# Patient Record
Sex: Female | Born: 1990 | Race: Black or African American | Hispanic: Yes | Marital: Married | Smoking: Never smoker
Health system: Southern US, Community
[De-identification: ages and names within clinical notes are randomized; demographics above are authoritative.]

## PROBLEM LIST (undated history)

## (undated) ENCOUNTER — Inpatient Hospital Stay (HOSPITAL_COMMUNITY): Payer: Self-pay

## (undated) DIAGNOSIS — G43909 Migraine, unspecified, not intractable, without status migrainosus: Secondary | ICD-10-CM

## (undated) DIAGNOSIS — R002 Palpitations: Secondary | ICD-10-CM

## (undated) HISTORY — PX: WISDOM TOOTH EXTRACTION: SHX21

## (undated) HISTORY — DX: Palpitations: R00.2

## (undated) HISTORY — DX: Migraine, unspecified, not intractable, without status migrainosus: G43.909

---
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
## 2013-04-12 ENCOUNTER — Ambulatory Visit: Payer: Self-pay | Admitting: Family Medicine

## 2015-05-02 ENCOUNTER — Encounter (HOSPITAL_COMMUNITY): Payer: Self-pay

## 2015-05-02 ENCOUNTER — Emergency Department (HOSPITAL_COMMUNITY)
Admission: EM | Admit: 2015-05-02 | Discharge: 2015-05-02 | Disposition: A | Discharge status: Home / Self Care | Payer: BLUE CROSS/BLUE SHIELD | Attending: Emergency Medicine | Admitting: Emergency Medicine

## 2015-05-02 DIAGNOSIS — S199XXA Unspecified injury of neck, initial encounter: Secondary | ICD-10-CM | POA: Insufficient documentation

## 2015-05-02 DIAGNOSIS — Y9389 Activity, other specified: Secondary | ICD-10-CM | POA: Insufficient documentation

## 2015-05-02 DIAGNOSIS — Y998 Other external cause status: Secondary | ICD-10-CM | POA: Insufficient documentation

## 2015-05-02 DIAGNOSIS — R519 Headache, unspecified: Secondary | ICD-10-CM

## 2015-05-02 DIAGNOSIS — Y92410 Unspecified street and highway as the place of occurrence of the external cause: Secondary | ICD-10-CM | POA: Insufficient documentation

## 2015-05-02 DIAGNOSIS — S0990XA Unspecified injury of head, initial encounter: Secondary | ICD-10-CM | POA: Insufficient documentation

## 2015-05-02 DIAGNOSIS — R51 Headache: Secondary | ICD-10-CM

## 2015-05-02 MED ORDER — HYDROCODONE-ACETAMINOPHEN 5-325 MG PO TABS: ORAL_TABLET | ORAL | Status: DC

## 2015-05-02 MED ORDER — HYDROCODONE-ACETAMINOPHEN 5-325 MG PO TABS
1.0000 | ORAL_TABLET | Freq: Once | ORAL | Status: AC
  Administered 2015-05-02: 1 via ORAL
  Filled 2015-05-02: qty 1
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
## 2015-05-02 NOTE — Discharge Instructions (Signed)
For pain control please take ibuprofen (also known as Motrin or Advil) 800mg (this is normally 4 over the counter pills) 3 times a day  for 5 days. Take with food to minimize stomach irritation. ° °Take vicodin for breakthrough pain, do not drink alcohol, drive, care for children or do other critical tasks while taking vicodin. ° ° °Do not hesitate to return to the emergency room for any new, worsening or concerning symptoms. ° °Please obtain primary care using resource guide below. Let them know that you were seen in the emergency room and that they will need to obtain records for further outpatient management. ° ° °Emergency Department Resource Guide °1) Find a Doctor and Pay Out of Pocket °Although you won't have to find out who is covered by your insurance plan, it is a good idea to ask around and get recommendations. You will then need to call the office and see if the doctor you have chosen will accept you as a new patient and what types of options they offer for patients who are self-pay. Some doctors offer discounts or will set up payment plans for their patients who do not have insurance, but you will need to ask so you aren't surprised when you get to your appointment. ° °2) Contact Your Local Health Department °Not all health departments have doctors that can see patients for sick visits, but many do, so it is worth a call to see if yours does. If you don't know where your local health department is, you can check in your phone book. The CDC also has a tool to help you locate your state's health department, and many state websites also have listings of all of their local health departments. ° °3) Find a Walk-in Clinic °If your illness is not likely to be very severe or complicated, you may want to try a walk in clinic. These are popping up all over the country in pharmacies, drugstores, and shopping centers. They're usually staffed by nurse practitioners or physician assistants that have been trained to  treat common illnesses and complaints. They're usually fairly quick and inexpensive. However, if you have serious medical issues or chronic medical problems, these are probably not your best option. ° °No Primary Care Doctor: °- Call [facility] at  832-8000 - they can help you locate a primary care doctor that  accepts your insurance, provides certain services, etc. °- Physician Referral Service- 1-800-533-3463 ° °Chronic Pain Problems: °Organization         Address  Phone   Notes  °[facility]  (336) 297-2271 Patients need to be referred by their primary care doctor.  ° °Medication Assistance: °Organization         Address  Phone   Notes  °[place] [facility] [address] °[place], [place] [address] (336) 641-8030 --Must be a resident of [place] °-- Must have NO insurance coverage whatsoever (no Medicaid/ Medicare, etc.) °-- The pt. MUST have a primary care doctor that directs their care regularly and follows them in the community °  °[facility]  (866) 331-1348   °[employer]  (888) 892-1162   ° °Agencies that provide inexpensive medical care: °Organization         Address  Phone   Notes  °[facility]  (336) 832-8035   °[facility]    (336) 832-7272   °[facility] [address] °[place], [place] [address] (336) 832-4777   °  [facility] [address], °[place] (336) 271-4999   °[facility]    (336) 373-0678   °[facility]    (336) 272-1050   °[facility] ° [address], [place] Phone:  (336) 832-4444, Fax:  (336) 832-4440 Hours of Operation:  9 am - 6 pm, M-F.  Also accepts Medicaid/Medicare and self-pay.  °[facility] ° [address], [place] Phone: (336) 832-3150, Fax: (336) [phone]. Hours of Operation:  8:30 am - 5:30 pm, M-F.  Also accepts Medicaid and self-pay.  °[facility]  [place] [address], [place] Phone: (336) 878-6027   °[facility] [address], [place], [place] (336)723-1848, Ext. 123 Mondays & Thursdays: 7-9 AM.  First 15 patients are seen on a first come, first serve basis. °  ° °Medicaid-accepting [place] Providers: ° °Organization         Address  Phone   Notes  °[facility] [address], [place] (336) 641-2100 Also accepts self-pay patients.  °[facility] [address], [place] ° (336) 856-9996   °[facility] [address], [place] (336) 288-8857   °[facility] [address] [place] [address], [place] (336) 299-7000   °Veita Bland [address], [place]  ° (336) 373-1557 Only accepts [place] Access Medicaid patients after they have their name applied to their card.  ° °Self-Pay (no insurance) in [place]: ° °Organization         Address  Phone   Notes  °Sickle Cell Patients, [facility] [address], [place] (336) 832-1970   °[facility] [address], [place] (336) 832-4400   °[facility] [place] [facility] ° [address], [place] (336) 992-4800   °[facility]/Dr. Osei-Bonsu ° [address] [place] [address], [place] or [address], [place] (336) 841-8500 Phone number for both [place] and [place] locations is the same.  °[facility] [address], [place] (336) 299-0000   °[facility] [address] [place] [address], [place] or [address] (336) 852-7530 °(336) 878-2260   °[facility] [address], [place] (336) 350-1642, phone; (336) 294-5005, fax Sees patients 1st and 3rd Saturday of every month.  Must not qualify for public or private insurance (i.e. Medicaid, Medicare, [place] Health Choice, Veterans' Benefits) • Household income should be no more than 200%  of the poverty level •The clinic cannot treat you if you are pregnant or think you are pregnant • Sexually transmitted diseases are not treated at the clinic.  ° ° °Dental Care: °Organization         Address  Phone  Notes  °[facility] [address],  (336) 641-6152 Accepts children up to age 21 who are enrolled in Medicaid or [place] Health Choice; pregnant women with a Medicaid card; and children who have applied for Medicaid or [place] Health Choice, but were declined, whose parents can pay a reduced fee at time of service.  °[facility] [place]  [address], [place] (336) 641-7733 Accepts children up to age 21 who are enrolled in Medicaid or  Health Choice; pregnant women with a Medicaid card; and   children who have applied for Medicaid or [place] Health Choice, but were declined, whose parents can pay a reduced fee at time of service.  °[facility] ° [address], [place] (336) 641-4533 Patients are seen by appointment only. Walk-ins are not accepted. [facility] will see patients 18 years of age and older. °Monday - Tuesday (8am-5pm) °Most Wednesdays (8:30-5pm) °$30 per visit, cash only  °[facility] ° [address], [place] (336) 641-4533 Patients are seen by appointment only. Walk-ins are not accepted. [facility] will see patients 18 years of age and older. °One Wednesday Evening (Monthly: Volunteer Based).  $30 per visit, cash only  °[facility]  (919) 537-3737 for adults; Children under age 4, call Graduate Pediatric Dentistry at ([phone]) [phone]. Children aged 4-14, please call (919) [phone] to request a pediatric application. ° Dental services are provided in all areas of dental care including fillings, crowns and bridges, complete and partial dentures, implants, gum treatment, root canals, and  extractions. Preventive care is also provided. Treatment is provided to both adults and children. °Patients are selected via a lottery and there is often a waiting list. °  °[facility] [address], °[place] ° (336) 763-8833 [email or web address] °  °[facility] [address], [place], [place] (336)723-1848, Ext. 123 Second and Fourth Thursday of each month, opens at 6:30 AM; Clinic ends at 9 AM.  Patients are seen on a first-come first-served basis, and a limited number are seen during each clinic.  ° °[facility] ° [address], [place], [place] (336) 723-7904   Eligibility Requirements °You must have lived in [place], [place], or [place] counties for at least the last three months. °  You cannot be eligible for state or federal sponsored healthcare insurance, including Veterans Administration, Medicaid, or Medicare. °  You generally cannot be eligible for healthcare insurance through your employer.  °  How to apply: °Eligibility screenings are held every Tuesday and Wednesday afternoon from 1:00 pm until 4:00 pm. You do not need an appointment for the interview!  °[facility] [address], [place], [place] 336-631-2330   °[facility]  336-342-8273   °[facility]  336-703-3100   °[facility]  336-570-6415   ° °Behavioral Health Resources in the Community: °Intensive Outpatient Programs °Organization         Address  Phone  Notes  °[facility] [address], [place], [place] 336-878-6098   °[facility] [address], [place], [place] 336-832-9800   °[facility] [address], [place], [place] ° 336-882-2125   °[facility] [address],  °[place],  1-800-853-5163 or 336-641-4981   °Substance Abuse Resources °Organization         Address  Phone  Notes  °[facility]  336-882-2125     °[facility]  336-784-9470   °[facility]  336-285-9073   °[facility]  336-845-3988   °Residential & Outpatient Substance Abuse Program  1-800-659-3381   °Psychological Services °Organization         Address  Phone  Notes  °[facility]  336- 832-9600   °[facility]  336- 378-7881   °[facility] [address], [place] 1-800-853-5163 or 336-641-4981   ° °Mobile Crisis Teams °Organization           Address  Phone  Notes  °[facility], Mobile Crisis Care Unit  1-877-626-1772   °Assertive °[facility] ° [address] [place], [place] 336-834-9664   °Sharon DeEsch [address] °[place] 336-554-5454   ° °Self-Help/Support Groups °Organization         Address  Phone             Notes  °[facility] [place] - variety of support groups  336- 373-1402 Call for more information  °Narcotics Anonymous (NA), [facility] [address], °[place]  2 meetings at this location  ° °Residential Treatment Programs °Organization         Address  Phone  Notes  °[facility] [address],    °[place]  1-866-801-8205   °[facility] ° [address], [place], [place] 704-293-8524   °[facility] [address], [place] 336-845-3988 Admissions: 8am-3pm M-F  °[facility] [address]    °[place], [place] 336-841-1104   °[facility] [address], [place], [place] 336-379-7146   °[facility] [address]  °[place], [place] 336-285-9073   °[facility] - Intensive Outpatient [address], [place], [place] 336-852-3033   °[facility] [address]  °[place], [place] 1-877-615-2722 or 336-784-9470   °[facility] [address] [place], [place] 336-227-7417 Accepts Medicaid  °[facility] [address]   °[place] 1-800-659-3381 Substance Abuse/Addiction Treatment  ° °[place] Behavioral Health Resources °Organization         Address  Phone  Notes  °[facility]  (888) 581-9988   °Julie Brannon, PhD [address] [place], [place]   (336) 349-5553 or (336) 951-0000   °[facility]   [address] °[place], [place] (336) 349-4454   °[facility] [address], [place], [place] (336) 342-8316 Insurance/Medicaid/sponsorship through [facility]  °[facility] [address]                                    [place], [place] (336) 342-8316 Therapy/tele-psych/case  °[facility] [address]  ° [place], [place] (336) 349-2233    °Dr. Arfeen  (336) 349-4544   °[facility] [place]  [employer] [place] [facility] 1) [address],  °2) [address], [place] °3)  [address], [place] (336) 349-3220 °(336) 342-7768 ° °(336) 342-8140   °[place] [facility] (336) 342-1394 or (336) 342-3537 (After Hours)    ° ° ° °
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
## 2015-05-02 NOTE — ED Notes (Signed)
Ice pack given to patient for forhead

## 2015-05-09 ENCOUNTER — Emergency Department (HOSPITAL_COMMUNITY)
Admission: EM | Admit: 2015-05-09 | Discharge: 2015-05-10 | Disposition: A | Discharge status: Home / Self Care | Payer: BLUE CROSS/BLUE SHIELD | Attending: Emergency Medicine | Admitting: Emergency Medicine

## 2015-05-09 ENCOUNTER — Encounter (HOSPITAL_COMMUNITY): Payer: Self-pay | Admitting: *Deleted

## 2015-05-09 DIAGNOSIS — N751 Abscess of Bartholin's gland: Secondary | ICD-10-CM | POA: Insufficient documentation

## 2015-05-09 MED ORDER — LIDOCAINE HCL (PF) 1 % IJ SOLN
5.0000 mL | Freq: Once | INTRAMUSCULAR | Status: AC
  Administered 2015-05-10: 5 mL via INTRADERMAL
  Filled 2015-05-09: qty 5

## 2015-05-09 MED ORDER — LIDOCAINE-EPINEPHRINE-TETRACAINE (LET) SOLUTION
3.0000 mL | Freq: Once | NASAL | Status: AC
  Administered 2015-05-10: 3 mL via TOPICAL
  Filled 2015-05-09: qty 3

## 2015-05-09 NOTE — ED Notes (Signed)
The pt has an abscess on her labia that started 3 days ago  lmp June 28th

## 2015-05-10 LAB — URINALYSIS, ROUTINE W REFLEX MICROSCOPIC
BILIRUBIN URINE: NEGATIVE
GLUCOSE, UA: NEGATIVE mg/dL
Hgb urine dipstick: NEGATIVE
KETONES UR: NEGATIVE mg/dL
NITRITE: NEGATIVE
PH: 7.5 (ref 5.0–8.0)
Protein, ur: NEGATIVE mg/dL
Specific Gravity, Urine: 1.007 (ref 1.005–1.030)
UROBILINOGEN UA: 1 mg/dL (ref 0.0–1.0)

## 2015-05-10 LAB — URINE MICROSCOPIC-ADD ON

## 2015-05-10 MED ORDER — MORPHINE SULFATE 4 MG/ML IJ SOLN
4.0000 mg | Freq: Once | INTRAMUSCULAR | Status: AC
  Administered 2015-05-10: 4 mg via INTRAVENOUS
  Filled 2015-05-10: qty 1

## 2015-05-10 NOTE — ED Provider Notes (Signed)
CSN: [id number]     Arrival date & time 05/09/15  2340 History   First MD Initiated Contact with Patient 05/09/15 2353     Chief Complaint  Patient presents with  . Abscess     (Consider location/radiation/quality/duration/timing/severity/associated sxs/prior Treatment) HPI   Blood pressure 134/80, pulse 107, temperature 97.7 F (36.5 C), temperature source Oral, resp. rate 16, weight 127 lb (57.607 kg), last menstrual period 04/28/2015, SpO2 99 %.  Sonya Green is a 24 y.o. female complaining of painful swelling to right labia worsening over the course of 5 days. No similar prior episodes. Ports urinary frequency without any abnormal vaginal discharge, fever, chills, nausea, vomiting. She does not have a local primary care or OB/GYN. Recently moved to the area.    History reviewed. No pertinent past medical history. History reviewed. No pertinent past surgical history. No family history on file. History  Substance Use Topics  . Smoking status: Never Smoker   . Smokeless tobacco: Not on file  . Alcohol Use: No   OB History    No data available     Review of Systems    Allergies  Review of patient's allergies indicates no known allergies.  Home Medications   Prior to Admission medications   Medication Sig Start Date End Date Taking? Authorizing Provider  HYDROcodone-acetaminophen (NORCO/VICODIN) 5-325 MG per tablet Take 1-2 tablets by mouth every 6 hours as needed for pain and/or cough. 05/02/15   Chetan Mehring, PA-C   BP 134/80 mmHg  Pulse 107  Temp(Src) 97.7 F (36.5 C) (Oral)  Resp 16  Wt 127 lb (57.607 kg)  SpO2 99%  LMP 04/28/2015 Physical Exam  Constitutional: She is oriented to person, place, and time. She appears well-developed and well-nourished. No distress.  HENT:  Head: Normocephalic.  Eyes: Conjunctivae and EOM are normal. Pupils are equal, round, and reactive to light.  Neck: Normal range of motion.  Cardiovascular: Normal rate.    Pulmonary/Chest: Effort normal. No stridor.  Genitourinary:  Right sided fluctuant tender Bartholin's gland cyst, no surrounding cellulitis, no drainage  Musculoskeletal: Normal range of motion.  Neurological: She is alert and oriented to person, place, and time.  Psychiatric:  Very nervous  Nursing note and vitals reviewed.   ED Course  INCISION AND DRAINAGE Date/Time: 05/10/2015 1:09 AM Performed by: Wynetta EmeryPISCIOTTA, Aleeah Greeno Authorized by: Wynetta EmeryPISCIOTTA, Massie Cogliano Consent: Verbal consent obtained. Consent given by: patient Required items: required blood products, implants, devices, and special equipment available Patient identity confirmed: verbally with patient Type: abscess (Right bartholin's) Body area: anogenital Location details: Bartholin's gland Anesthesia: local infiltration Local anesthetic: lidocaine 1% without epinephrine and LET (lido,epi,tetracaine) Anesthetic total: 1 ml Patient sedated: no Scalpel size: 11 Incision type: single straight Complexity: simple Drainage: purulent Drainage amount: moderate Wound treatment: wound left open Patient tolerance: Patient tolerated the procedure well with no immediate complications   (including critical care time) Labs Review Labs Reviewed - No data to display  Imaging Review No results found.   EKG Interpretation None      MDM   Final diagnoses:  Bartholin's gland abscess    Filed Vitals:   05/09/15 2349  BP: 134/80  Pulse: 107  Temp: 97.7 F (36.5 C)  TempSrc: Oral  Resp: 16  Weight: 127 lb (57.607 kg)  SpO2: 99%    Medications  lidocaine-EPINEPHrine-tetracaine (LET) solution (3 mLs Topical Given 05/10/15 0019)  lidocaine (PF) (XYLOCAINE) 1 % injection 5 mL (5 mLs Intradermal Given 05/10/15 0019)  morphine 4 MG/ML injection 4 mg (  4 mg Intravenous Given 05/10/15 0019)    Sonya Endo is a pleasant 24 y.o. female presenting with right sided bartholin's gland abscess. I and D performed and moderate amount of  purulent material is expressed. GC and abscess cultures are obtained. Patient is instructed on wound care and return precautions. [facility]  This is a shared visit with the attending physician who personally evaluated the patient and agrees with the care plan.   Evaluation does not show pathology that would require ongoing emergent intervention or inpatient treatment. Pt is hemodynamically stable and mentating appropriately. Discussed findings and plan with patient/guardian, who agrees with care plan. All questions answered. Return precautions discussed and outpatient follow up given.       Wynetta Emery, PA-C 05/10/15 0115  Blake Divine, MD 05/12/15 [phone]

## 2015-05-10 NOTE — Discharge Instructions (Signed)
Please follow with your primary care doctor in the next 2 days for a check-up. They must obtain records for further management.  ° °Do not hesitate to return to the Emergency Department for any new, worsening or concerning symptoms.  ° ° °Abscess °Care After °An abscess (also called a boil or furuncle) is an infected area that contains a collection of pus. Signs and symptoms of an abscess include pain, tenderness, redness, or hardness, or you may feel a moveable soft area under your skin. An abscess can occur anywhere in the body. The infection may spread to surrounding tissues causing cellulitis. A cut (incision) by the surgeon was made over your abscess and the pus was drained out. Gauze may have been packed into the space to provide a drain that will allow the cavity to heal from the inside outwards. The boil may be painful for 5 to 7 days. Most people with a boil do not have high fevers. Your abscess, if seen early, may not have localized, and may not have been lanced. If not, another appointment may be required for this if it does not get better on its own or with medications. °HOME CARE INSTRUCTIONS  °· Only take over-the-counter or prescription medicines for pain, discomfort, or fever as directed by your caregiver. °· When you bathe, soak and then remove gauze or iodoform packs at least daily or as directed by your caregiver. You may then wash the wound gently with mild soapy water. Repack with gauze or do as your caregiver directs. °SEEK IMMEDIATE MEDICAL CARE IF:  °· You develop increased pain, swelling, redness, drainage, or bleeding in the wound site. °· You develop signs of generalized infection including muscle aches, chills, fever, or a general ill feeling. °· An oral temperature above 102° F (38.9° C) develops, not controlled by medication. °See your caregiver for a recheck if you develop any of the symptoms described above. If medications (antibiotics) were prescribed, take them as  directed. °Document Released: 05/13/2005 Document Revised: 01/17/2012 Document Reviewed: 01/08/2008 °ExitCare® Patient Information ©2015 [employer]. This information is not intended to replace advice given to you by your health care provider. Make sure you discuss any questions you have with your health care provider. ° °

## 2015-05-12 LAB — CULTURE, ROUTINE-ABSCESS

## 2015-05-13 ENCOUNTER — Telehealth (HOSPITAL_BASED_OUTPATIENT_CLINIC_OR_DEPARTMENT_OTHER): Payer: Self-pay | Admitting: Emergency Medicine

## 2015-05-13 LAB — GC/CHLAMYDIA PROBE AMP (~~LOC~~) NOT AT ARMC
CHLAMYDIA, DNA PROBE: POSITIVE — AB
NEISSERIA GONORRHEA: NEGATIVE

## 2015-05-13 NOTE — Progress Notes (Signed)
ED Antimicrobial Stewardship Positive Culture Follow Up   Sonya Green is an 24 y.o. female who presented to [facility] 05[facility]2015 with a chief complaint of  Chief Complaint  Patient presents with  . Abscess    Recent Results (from the past 720 hour(s))  Culture, routine-abscess     Status: None   Collection Time: 05/10/15  1:00 AM  Result Value Ref Range Status   Specimen Description ABSCESS  Final   Special Requests RIGHT LABIA  Final   Gram Stain   Final    ABUNDANT WBC PRESENT,BOTH PMN AND MONONUCLEAR NO SQUAMOUS EPITHELIAL CELLS SEEN FEW GRAM POSITIVE COCCI IN CLUSTERS Performed at [facility]    Culture   Final    MODERATE STAPHYLOCOCCUS AUREUS Note: RIFAMPIN AND GENTAMICIN SHOULD NOT BE USED AS SINGLE DRUGS FOR TREATMENT OF STAPH INFECTIONS. Performed at [facility]    Report Status 05/12/2015 FINAL  Final   Organism ID, Bacteria STAPHYLOCOCCUS AUREUS  Final      Susceptibility   Staphylococcus aureus - MIC*    CLINDAMYCIN <=0.25 SENSITIVE Sensitive     ERYTHROMYCIN <=0.25 SENSITIVE Sensitive     GENTAMICIN <=0.5 SENSITIVE Sensitive     LEVOFLOXACIN 4 INTERMEDIATE Intermediate     OXACILLIN <=0.25 SENSITIVE Sensitive     PENICILLIN 0.06 SENSITIVE Sensitive     RIFAMPIN <=0.5 SENSITIVE Sensitive     TRIMETH/SULFA <=10 SENSITIVE Sensitive     VANCOMYCIN 1 SENSITIVE Sensitive     TETRACYCLINE <=1 SENSITIVE Sensitive     MOXIFLOXACIN 2 RESISTANT Resistant     * MODERATE STAPHYLOCOCCUS AUREUS    [x]  Patient discharged originally without antimicrobial agent and treatment is now indicated  New antibiotic prescription: Bactrim DS 1 Tab BID x 1 week  ED Provider: Allen DerryMercedes Camprubi-Soms, PA-C  Sonya Green 05/13/2015, 8:12 AM Infectious Diseases Pharmacist Phone# [phone]

## 2015-05-14 ENCOUNTER — Telehealth (HOSPITAL_BASED_OUTPATIENT_CLINIC_OR_DEPARTMENT_OTHER): Payer: Self-pay | Admitting: Emergency Medicine

## 2015-05-14 NOTE — Telephone Encounter (Signed)
Spoke with patient @ 1100 05/13/15, notified of staph in abcess , requested antibiotic Bactrim DS on tab po bid x one week, ordered by Ebbie Ridgehris Lawyer PA, [facility] to [facility] [address] [address] @ [phone]

## 2015-05-14 NOTE — Telephone Encounter (Signed)
Chart handoff to EDP for treatment plan for + chlamydia 

## 2015-05-15 ENCOUNTER — Telehealth (HOSPITAL_COMMUNITY): Payer: Self-pay

## 2015-05-15 NOTE — ED Notes (Signed)
Chart returned from EDP office. Dr Jeraldine LootsLockwood reviewed chart and ordered Zithromax 1 gram po x 1 with no refills. Pt notified and advised to abstain from sexual activity x 10 days and to notify partner(s) for testing and treatment.  [facility] [address] [phone] and left on MD voicemail.

## 2015-05-17 ENCOUNTER — Telehealth: Payer: Self-pay | Admitting: Emergency Medicine

## 2015-05-17 NOTE — Telephone Encounter (Signed)
Patient has Zithromax allergy, RX Doxycycline 100 mg PO BID x seven days, prescriber, Ivar Drapeob Browning PA,  [facility] [phone], Patient notified of change and that new RX called in .

## 2015-05-22 ENCOUNTER — Ambulatory Visit: Payer: Self-pay | Admitting: Family

## 2015-05-29 ENCOUNTER — Telehealth: Payer: Self-pay | Admitting: *Deleted

## 2015-05-29 NOTE — Telephone Encounter (Signed)
Pt called about Rx called in by Berle Mull, RN.  NCM reviewed notes to find: "Spoke with patient @ 1100 05/13/15, notified of staph in abcess , requested antibiotic Bactrim DS on tab po bid x one week, ordered by Ebbie Ridge PA, called [facility] [address] @ [phone]"  NCM shared with pt that Rx called in [facility].  Pt say she will check with them as she called [facility] [address] instead.

## 2015-05-30 ENCOUNTER — Ambulatory Visit: Payer: Self-pay | Admitting: Family Medicine

## 2015-06-12 ENCOUNTER — Ambulatory Visit: Payer: BLUE CROSS/BLUE SHIELD

## 2015-06-12 ENCOUNTER — Ambulatory Visit (INDEPENDENT_AMBULATORY_CARE_PROVIDER_SITE_OTHER): Payer: BLUE CROSS/BLUE SHIELD | Admitting: Family

## 2015-06-12 ENCOUNTER — Encounter: Payer: Self-pay | Admitting: Family

## 2015-06-12 VITALS — BP 120/84 | HR 91 | Temp 97.6°F | Resp 18 | Ht 67.0 in | Wt 130.8 lb

## 2015-06-12 DIAGNOSIS — Z3009 Encounter for other general counseling and advice on contraception: Secondary | ICD-10-CM

## 2015-06-12 DIAGNOSIS — Z309 Encounter for contraceptive management, unspecified: Secondary | ICD-10-CM | POA: Diagnosis not present

## 2015-06-12 MED ORDER — MEDROXYPROGESTERONE ACETATE 150 MG/ML IM SUSP
150.0000 mg | Freq: Once | INTRAMUSCULAR | Status: AC
  Administered 2015-06-12: 150 mg via INTRAMUSCULAR

## 2015-06-12 MED ORDER — MEDROXYPROGESTERONE ACETATE 150 MG/ML IM SUSP: 150.0000 mg | INTRAMUSCULAR | Status: DC

## 2015-06-12 NOTE — Patient Instructions (Addendum)
Thank you for choosing [facility].  Summary/Instructions:  Referrals have been made during this visit. You should expect to hear back from our schedulers in about 7-10 days in regards to establishing an appointment with the specialists we discussed.   If your symptoms worsen or fail to improve, please contact our office for further instruction, or in case of emergency go directly to the emergency room at the closest medical facility.     

## 2015-06-12 NOTE — Progress Notes (Signed)
Subjective:    Patient ID: Sonya Green, female    DOB: Jul 03, 1991, 24 y.o.   MRN: [id number]  Chief Complaint  Patient presents with  . Establish Care    Referral to GYN, would like to start birth control    HPI:  Sonya Green is a 24 y.o. female with a PMH of migraines and hepatitis/jaundice who presents today for an office visit to establish care.   1.) Birth Control - Previously maintained on [name] and has noted the associated symptom hormonal issues. She has been on the [name] for several years and would like to explore other options of birth control.   Allergies  Allergen Reactions  . Sulfa Antibiotics Rash  . Zithromax [Azithromycin] Rash     Outpatient Prescriptions Prior to Visit  Medication Sig Dispense Refill  . HYDROcodone-acetaminophen (NORCO/VICODIN) 5-325 MG per tablet Take 1-2 tablets by mouth every 6 hours as needed for pain and/or cough. 7 tablet 0   No facility-administered medications prior to visit.     Past Medical History  Diagnosis Date  . Jaundice of newborn   . Migraines      History reviewed. No pertinent past surgical history.   Family History  Problem Relation Age of Onset  . Healthy Mother   . Arthritis Maternal Grandmother   . Stroke Maternal Grandmother   . Diabetes Maternal Grandmother   . Lung cancer Maternal Grandfather   . Diabetes Paternal Grandmother      History   Social History  . Marital Status: Single    Spouse Name: N/A  . Number of Children: N/A  . Years of Education: 12   Occupational History  . Housekeeping    Social History Main Topics  . Smoking status: Former Games developer  . Smokeless tobacco: Never Used  . Alcohol Use: Yes     Comment: Occasionally  . Drug Use: No  . Sexual Activity: Yes    Birth Control/ Protection: None, Injection   Other Topics Concern  . Not on file   Social History Narrative   Fun: Sports   Denies religious beliefs effecting health care.   Denies abuse  and feels safe at home.       Review of Systems  Constitutional: Negative for fever and chills.  Respiratory: Negative for chest tightness and shortness of breath.   Cardiovascular: Negative for chest pain, palpitations and leg swelling.  Gastrointestinal: Negative for nausea.  Genitourinary: Negative for menstrual problem.      Objective:    BP 120/84 mmHg  Pulse 91  Temp(Src) 97.6 F (36.4 C) (Oral)  Resp 18  Ht 5\' 7"  (1.702 m)  Wt 130 lb 12.8 oz (59.33 kg)  BMI 20.48 kg/m2  SpO2 99% Nursing note and vital signs reviewed.  Physical Exam  Constitutional: She is oriented to person, place, and time. She appears well-developed and well-nourished. No distress.  Cardiovascular: Normal rate, regular rhythm, normal heart sounds and intact distal pulses.   Pulmonary/Chest: Effort normal and breath sounds normal.  Neurological: She is alert and oriented to person, place, and time.  Skin: Skin is warm and dry.  Psychiatric: She has a normal mood and affect. Her behavior is normal. Judgment and thought content normal.       Assessment & Plan:   Problem List Items Addressed This Visit      Other   Birth control counseling - Primary    Currently maintained on [name]. Discussed options for alternative methods of birth control including intrauterine  devices, oral contraceptives, and Nexplanon. Following discussion she would like to stay with her current birth control at this time and would like to be referred to GYN for further management of her birth control. Referral to GYN placed. Continue current dosage [name]. Follow up as needed.       Relevant Medications   medroxyPROGESTERone ([name]) 150 MG/ML injection   Other Relevant Orders   Ambulatory referral to Gynecology

## 2015-06-12 NOTE — Assessment & Plan Note (Signed)
Currently maintained on [name]. Discussed options for alternative methods of birth control including intrauterine devices, oral contraceptives, and Nexplanon. Following discussion she would like to stay with her current birth control at this time and would like to be referred to GYN for further management of her birth control. Referral to GYN placed. Continue current dosage [name]. Follow up as needed.

## 2015-06-12 NOTE — Progress Notes (Signed)
Pre visit review using our clinic review tool, if applicable. No additional management support is needed unless otherwise documented below in the visit note. 

## 2015-07-02 ENCOUNTER — Encounter: Payer: Self-pay | Admitting: Family

## 2015-09-04 ENCOUNTER — Emergency Department (HOSPITAL_COMMUNITY)
Admission: EM | Admit: 2015-09-04 | Discharge: 2015-09-04 | Disposition: A | Discharge status: Home / Self Care | Payer: Self-pay

## 2015-10-22 ENCOUNTER — Other Ambulatory Visit: Payer: BLUE CROSS/BLUE SHIELD

## 2015-10-22 ENCOUNTER — Encounter: Payer: Self-pay | Admitting: Family

## 2015-10-22 ENCOUNTER — Ambulatory Visit (INDEPENDENT_AMBULATORY_CARE_PROVIDER_SITE_OTHER): Payer: BLUE CROSS/BLUE SHIELD | Admitting: Family

## 2015-10-22 VITALS — BP 120/74 | HR 104 | Temp 97.8°F | Resp 16 | Ht 67.0 in | Wt 131.4 lb

## 2015-10-22 DIAGNOSIS — Z3009 Encounter for other general counseling and advice on contraception: Secondary | ICD-10-CM

## 2015-10-22 DIAGNOSIS — Z30011 Encounter for initial prescription of contraceptive pills: Secondary | ICD-10-CM | POA: Diagnosis not present

## 2015-10-22 DIAGNOSIS — Z309 Encounter for contraceptive management, unspecified: Secondary | ICD-10-CM

## 2015-10-22 DIAGNOSIS — R21 Rash and other nonspecific skin eruption: Secondary | ICD-10-CM

## 2015-10-22 DIAGNOSIS — Z23 Encounter for immunization: Secondary | ICD-10-CM | POA: Diagnosis not present

## 2015-10-22 MED ORDER — MEDROXYPROGESTERONE ACETATE 150 MG/ML IM SUSP
150.0000 mg | Freq: Once | INTRAMUSCULAR | Status: AC
  Administered 2015-10-22: 150 mg via INTRAMUSCULAR

## 2015-10-22 NOTE — Addendum Note (Signed)
Addended by: Mercer PodWRENN, Bronsyn Shappell E on: 10/22/2015 03:50 PM   Modules accepted: Orders

## 2015-10-23 ENCOUNTER — Telehealth: Payer: Self-pay | Admitting: Family

## 2015-10-23 LAB — HSV 2 ANTIBODY, IGG

## 2015-10-23 NOTE — Telephone Encounter (Signed)
Please inform patient that her blood work was negative indicating that it is likely related to shaving

## 2015-10-27 NOTE — Telephone Encounter (Signed)
Left detailed message letting pt know. 

## 2016-04-02 ENCOUNTER — Ambulatory Visit (HOSPITAL_COMMUNITY)
Admission: EM | Admit: 2016-04-02 | Discharge: 2016-04-02 | Disposition: A | Discharge status: Home / Self Care | Payer: Managed Care, Other (non HMO) | Attending: Emergency Medicine | Admitting: Emergency Medicine

## 2016-04-02 ENCOUNTER — Encounter (HOSPITAL_COMMUNITY): Payer: Self-pay | Admitting: *Deleted

## 2016-04-02 DIAGNOSIS — N390 Urinary tract infection, site not specified: Secondary | ICD-10-CM

## 2016-04-02 LAB — POCT URINALYSIS DIP (DEVICE)
Bilirubin Urine: NEGATIVE
GLUCOSE, UA: NEGATIVE mg/dL
Hgb urine dipstick: NEGATIVE
Ketones, ur: NEGATIVE mg/dL
NITRITE: POSITIVE — AB
PH: 7 (ref 5.0–8.0)
PROTEIN: NEGATIVE mg/dL
Specific Gravity, Urine: 1.025 (ref 1.005–1.030)
Urobilinogen, UA: 0.2 mg/dL (ref 0.0–1.0)

## 2016-04-02 LAB — POCT PREGNANCY, URINE: PREG TEST UR: NEGATIVE

## 2016-04-02 MED ORDER — NITROFURANTOIN MONOHYD MACRO 100 MG PO CAPS: 100.0000 mg | ORAL_CAPSULE | Freq: Two times a day (BID) | ORAL | Status: DC

## 2016-04-02 NOTE — Discharge Instructions (Signed)
It appears you have a urinary tract infection. Will treat with an antibiotic. Drink plenty of water as well. If you worsen such as high fevers, severe nausea or vomitng, or worseing back pain then please f/u. Feel better.   Urinary Tract Infection A urinary tract infection (UTI) can occur any place along the urinary tract. The tract includes the kidneys, ureters, bladder, and urethra. A type of germ called bacteria often causes a UTI. UTIs are often helped with antibiotic medicine.  HOME CARE   If given, take antibiotics as told by your doctor. Finish them even if you start to feel better.  Drink enough fluids to keep your pee (urine) clear or pale yellow.  Avoid tea, drinks with caffeine, and bubbly (carbonated) drinks.  Pee often. Avoid holding your pee in for a long time.  Pee before and after having sex (intercourse).  Wipe from front to back after you poop (bowel movement) if you are a woman. Use each tissue only once. GET HELP RIGHT AWAY IF:   You have back pain.  You have lower belly (abdominal) pain.  You have chills.  You feel sick to your stomach (nauseous).  You throw up (vomit).  Your burning or discomfort with peeing does not go away.  You have a fever.  Your symptoms are not better in 3 days. MAKE SURE YOU:   Understand these instructions.  Will watch your condition.  Will get help right away if you are not doing well or get worse.   This information is not intended to replace advice given to you by your health care provider. Make sure you discuss any questions you have with your health care provider.   Document Released: 04/12/2008 Document Revised: 11/15/2014 Document Reviewed: 05/25/2012 Elsevier Interactive Patient Education [employer]2016 Elsevier Inc.

## 2016-04-02 NOTE — ED Notes (Signed)
Pt  Reports  Pain  l  Side of back  Radiating  To  llq    For sev  Days  -  She  Reports  Some  Low  abd / bladder  Pressure  As   Well      pt  Reports  Has     Been  Drinking  Extra  Water     And  Reports the symptoms  Are  Similar  As to  When  She  Had  A  Bladder  Infection in past

## 2016-04-02 NOTE — ED Provider Notes (Signed)
CSN: [id number]     Arrival date & time 04/02/16  1511 History   First MD Initiated Contact with Patient 04/02/16 1535     Chief Complaint  Patient presents with  . Flank Pain   (Consider location/radiation/quality/duration/timing/severity/associated sxs/prior Treatment) HPI Comments: Patient is a 25 yo female who presents with a 4 days history of right sided flank pain and "pressure" in her bladder. She has felt this way before when she had a UTI. She notes no dysuria, but frequency. No N, V or fevers. No vaginal pain or discharge.   The history is provided by the patient.    Past Medical History  Diagnosis Date  . Jaundice of newborn   . Migraines    History reviewed. No pertinent past surgical history. Family History  Problem Relation Age of Onset  . Healthy Mother   . Arthritis Maternal Grandmother   . Stroke Maternal Grandmother   . Diabetes Maternal Grandmother   . Lung cancer Maternal Grandfather   . Diabetes Paternal Grandmother    Social History  Substance Use Topics  . Smoking status: Former Games developermoker  . Smokeless tobacco: Never Used  . Alcohol Use: Yes     Comment: Occasionally   OB History    No data available     Review of Systems  Constitutional: Negative for fever and chills.  Gastrointestinal: Negative for nausea and abdominal pain.  Genitourinary: Positive for frequency and flank pain. Negative for dysuria, urgency, vaginal bleeding and pelvic pain.  Musculoskeletal: Positive for back pain.    Allergies  Sulfa antibiotics and Zithromax  Home Medications   Prior to Admission medications   Medication Sig Start Date End Date Taking? Authorizing Provider  medroxyPROGESTERone ([name]) 150 MG/ML injection Inject 1 mL (150 mg total) into the muscle every 3 (three) months. 06/12/15   Veryl SpeakGregory D Calone, FNP  nitrofurantoin, macrocrystal-monohydrate, (MACROBID) 100 MG capsule Take 1 capsule (100 mg total) by mouth 2 (two) times daily. 04/02/16   Riki SheerMichelle G  Young, PA-C   Meds Ordered and Administered this Visit  Medications - No data to display  BP 119/79 mmHg  Pulse 77  Temp(Src) 98.4 F (36.9 C) (Oral)  Resp 12  SpO2 100%  LMP  (Within Months) No data found.   Physical Exam  Constitutional: She is oriented to person, place, and time. She appears well-developed and well-nourished. No distress.  Abdominal: Soft. Bowel sounds are normal. She exhibits no distension. There is no tenderness. There is no rebound and no guarding.  Mild tenderness with percussion of right CVA region  Neurological: She is alert and oriented to person, place, and time.  Skin: Skin is warm and dry. She is not diaphoretic.  Psychiatric: Her behavior is normal.  Nursing note and vitals reviewed.   ED Course  Procedures (including critical care time)  Labs Review Labs Reviewed  POCT URINALYSIS DIP (DEVICE) - Abnormal; Notable for the following:    Nitrite POSITIVE (*)    Leukocytes, UA SMALL (*)    All other components within normal limits  POCT PREGNANCY, URINE    Imaging Review No results found.   Visual Acuity Review  Right Eye Distance:   Left Eye Distance:   Bilateral Distance:    Right Eye Near:   Left Eye Near:    Bilateral Near:         MDM   1. UTI (lower urinary tract infection)    Treat with Macrobid, and fluids. If worsens then to FU.  Riki Sheer, PA-C 04/02/16 1607

## 2017-01-24 ENCOUNTER — Telehealth: Payer: Self-pay

## 2017-01-24 NOTE — Telephone Encounter (Signed)
Called [phone] to schedule new ob appt. Left v/m  For pt to call our office to schedule appt.

## 2017-03-09 DIAGNOSIS — O0993 Supervision of high risk pregnancy, unspecified, third trimester: Secondary | ICD-10-CM | POA: Insufficient documentation

## 2017-03-10 ENCOUNTER — Encounter: Payer: Managed Care, Other (non HMO) | Admitting: Obstetrics and Gynecology

## 2017-03-17 LAB — OB RESULTS CONSOLE HGB/HCT, BLOOD
HCT: 38
Hemoglobin: 12.6

## 2017-03-17 LAB — OB RESULTS CONSOLE ABO/RH
"RH Type ": NEGATIVE
"RH Type ": POSITIVE

## 2017-03-17 LAB — SICKLE CELL SCREEN: Sickle Cell Screen: NORMAL

## 2017-03-17 LAB — OB RESULTS CONSOLE HIV ANTIBODY (ROUTINE TESTING): HIV: NONREACTIVE

## 2017-03-17 LAB — CYSTIC FIBROSIS DIAGNOSTIC STUDY: Interpretation-CFDNA:: NEGATIVE

## 2017-03-17 LAB — OB RESULTS CONSOLE HEPATITIS B SURFACE ANTIGEN: Hepatitis B Surface Ag: NEGATIVE

## 2017-03-17 LAB — OB RESULTS CONSOLE ANTIBODY SCREEN: Antibody Screen: NEGATIVE

## 2017-03-17 LAB — OB RESULTS CONSOLE RUBELLA ANTIBODY, IGM: Rubella: IMMUNE

## 2017-03-17 LAB — OB RESULTS CONSOLE GC/CHLAMYDIA
CHLAMYDIA, DNA PROBE: NEGATIVE
GC PROBE AMP, GENITAL: NEGATIVE

## 2017-03-17 LAB — OB RESULTS CONSOLE PLATELET COUNT: Platelets: 236

## 2017-03-17 LAB — OB RESULTS CONSOLE VARICELLA ZOSTER ANTIBODY, IGG: Varicella: IMMUNE

## 2017-03-24 ENCOUNTER — Encounter: Payer: Managed Care, Other (non HMO) | Admitting: Certified Nurse Midwife

## 2017-05-09 LAB — OB RESULTS CONSOLE HGB/HCT, BLOOD
HCT: 40
Hemoglobin: 13.2

## 2017-05-09 LAB — OB RESULTS CONSOLE RPR: RPR: NONREACTIVE

## 2017-05-09 LAB — OB RESULTS CONSOLE HIV ANTIBODY (ROUTINE TESTING): HIV: NONREACTIVE

## 2017-06-18 ENCOUNTER — Inpatient Hospital Stay (HOSPITAL_COMMUNITY)
Admission: EM | Admit: 2017-06-18 | Discharge: 2017-06-18 | Disposition: A | Discharge status: Home / Self Care | Payer: Managed Care, Other (non HMO) | Attending: Family Medicine | Admitting: Family Medicine

## 2017-06-18 ENCOUNTER — Encounter (HOSPITAL_COMMUNITY): Payer: Self-pay | Admitting: Emergency Medicine

## 2017-06-18 ENCOUNTER — Emergency Department (HOSPITAL_COMMUNITY): Payer: Managed Care, Other (non HMO)

## 2017-06-18 DIAGNOSIS — Z87891 Personal history of nicotine dependence: Secondary | ICD-10-CM | POA: Diagnosis not present

## 2017-06-18 DIAGNOSIS — R0602 Shortness of breath: Secondary | ICD-10-CM | POA: Insufficient documentation

## 2017-06-18 DIAGNOSIS — Z3A33 33 weeks gestation of pregnancy: Secondary | ICD-10-CM | POA: Diagnosis not present

## 2017-06-18 DIAGNOSIS — R0789 Other chest pain: Secondary | ICD-10-CM | POA: Diagnosis present

## 2017-06-18 DIAGNOSIS — O1410 Severe pre-eclampsia, unspecified trimester: Secondary | ICD-10-CM | POA: Diagnosis present

## 2017-06-18 DIAGNOSIS — O133 Gestational [pregnancy-induced] hypertension without significant proteinuria, third trimester: Secondary | ICD-10-CM | POA: Insufficient documentation

## 2017-06-18 DIAGNOSIS — Z7982 Long term (current) use of aspirin: Secondary | ICD-10-CM | POA: Insufficient documentation

## 2017-06-18 DIAGNOSIS — O26893 Other specified pregnancy related conditions, third trimester: Secondary | ICD-10-CM | POA: Insufficient documentation

## 2017-06-18 DIAGNOSIS — Z3689 Encounter for other specified antenatal screening: Secondary | ICD-10-CM

## 2017-06-18 DIAGNOSIS — I159 Secondary hypertension, unspecified: Secondary | ICD-10-CM

## 2017-06-18 LAB — CBC
HEMATOCRIT: 38.4 % (ref 36.0–46.0)
HEMOGLOBIN: 13.6 g/dL (ref 12.0–15.0)
MCH: 32.2 pg (ref 26.0–34.0)
MCHC: 35.4 g/dL (ref 30.0–36.0)
MCV: 90.8 fL (ref 78.0–100.0)
Platelets: 254 10*3/uL (ref 150–400)
RBC: 4.23 MIL/uL (ref 3.87–5.11)
RDW: 12.3 % (ref 11.5–15.5)
WBC: 11.1 10*3/uL — AB (ref 4.0–10.5)

## 2017-06-18 LAB — BASIC METABOLIC PANEL
ANION GAP: 11 (ref 5–15)
BUN: 10 mg/dL (ref 6–20)
CALCIUM: 9.7 mg/dL (ref 8.9–10.3)
CO2: 19 mmol/L — ABNORMAL LOW (ref 22–32)
Chloride: 104 mmol/L (ref 101–111)
Creatinine, Ser: 0.68 mg/dL (ref 0.44–1.00)
Glucose, Bld: 81 mg/dL (ref 65–99)
POTASSIUM: 4.1 mmol/L (ref 3.5–5.1)
SODIUM: 134 mmol/L — AB (ref 135–145)

## 2017-06-18 LAB — URINALYSIS, ROUTINE W REFLEX MICROSCOPIC
BILIRUBIN URINE: NEGATIVE
Glucose, UA: NEGATIVE mg/dL
HGB URINE DIPSTICK: NEGATIVE
KETONES UR: NEGATIVE mg/dL
Nitrite: POSITIVE — AB
PH: 7 (ref 5.0–8.0)
Protein, ur: NEGATIVE mg/dL
Specific Gravity, Urine: 1.009 (ref 1.005–1.030)

## 2017-06-18 LAB — LACTATE DEHYDROGENASE: LDH: 181 U/L (ref 98–192)

## 2017-06-18 LAB — HEPATIC FUNCTION PANEL
ALK PHOS: 281 U/L — AB (ref 38–126)
ALT: 33 U/L (ref 14–54)
AST: 39 U/L (ref 15–41)
Albumin: 3.4 g/dL — ABNORMAL LOW (ref 3.5–5.0)
BILIRUBIN DIRECT: 0.1 mg/dL (ref 0.1–0.5)
BILIRUBIN TOTAL: 0.6 mg/dL (ref 0.3–1.2)
Indirect Bilirubin: 0.5 mg/dL (ref 0.3–0.9)
TOTAL PROTEIN: 6.8 g/dL (ref 6.5–8.1)

## 2017-06-18 LAB — PROTEIN / CREATININE RATIO, URINE
Creatinine, Urine: 44 mg/dL
Protein Creatinine Ratio: 0.14 mg/mg{Cre} (ref 0.00–0.15)
Total Protein, Urine: 6 mg/dL

## 2017-06-18 MED ORDER — IOPAMIDOL (ISOVUE-370) INJECTION 76%
INTRAVENOUS | Status: AC
  Administered 2017-06-18: 80 mL
  Filled 2017-06-18: qty 100

## 2017-06-18 MED ORDER — HYDRALAZINE HCL 20 MG/ML IJ SOLN
5.0000 mg | Freq: Once | INTRAMUSCULAR | Status: AC
  Administered 2017-06-18: 5 mg via INTRAVENOUS

## 2017-06-18 MED ORDER — SODIUM CHLORIDE 0.9 % IV BOLUS (SEPSIS)
500.0000 mL | Freq: Once | INTRAVENOUS | Status: AC
  Administered 2017-06-18: 500 mL via INTRAVENOUS

## 2017-06-18 MED ORDER — ASPIRIN EC 81 MG PO TBEC: 81.0000 mg | DELAYED_RELEASE_TABLET | Freq: Every day | ORAL | 0 refills | Status: DC

## 2017-06-18 MED ORDER — HYDRALAZINE HCL 20 MG/ML IJ SOLN
INTRAMUSCULAR | Status: DC
Start: 2017-06-18 — End: 2017-06-18
  Filled 2017-06-18: qty 1

## 2017-06-18 NOTE — MAU Provider Note (Signed)
History     CSN: [id number]  Arrival date and time: 06/18/17 0126   First Provider Initiated Contact with Patient 06/18/17 [phone]      Chief Complaint  Patient presents with  . Shortness of Breath  . Pregnant   G1 @33 .5 wks here with CP and SOB. Sx started about 1 day ago. Sx are intermittent. CP is between breasts just above abdomen in the epigastric region. Sx are not occurring currently. She reports intermittent heartburn. She denies HA and visual disturbances. Reports good FM. No VB, LOF, or ctx. [facility] and E[facility] and chest CT was negative. Her BP was found to elevated and she was transferred here for Preeclampsia workup. Her pregnancy has been uncomplicated and [facility]'s [facility].    OB History    Gravida Para Term Preterm AB Living   1             SAB TAB Ectopic Multiple Live Births                  Past Medical History:  Diagnosis Date  . Jaundice of newborn   . Migraines     Past Surgical History:  Procedure Laterality Date  . WISDOM TOOTH EXTRACTION      Family History  Problem Relation Age of Onset  . Healthy Mother   . Arthritis Maternal Grandmother   . Stroke Maternal Grandmother   . Diabetes Maternal Grandmother   . Lung cancer Maternal Grandfather   . Diabetes Paternal Grandmother     Social History  Substance Use Topics  . Smoking status: Former Games developermoker  . Smokeless tobacco: Never Used  . Alcohol use Yes     Comment: Occasionally    Allergies:  Allergies  Allergen Reactions  . Sulfa Antibiotics Rash  . Zithromax [Azithromycin] Rash    Prescriptions Prior to Admission  Medication Sig Dispense Refill Last Dose  . Prenatal Vit-Fe Fumarate-FA (PRENATAL MULTIVITAMIN) TABS tablet Take 1 tablet by mouth daily at 12 noon.   06/17/2017 at Unknown time  . medroxyPROGESTERone ([name]) 150 MG/ML injection Inject 1 mL (150 mg total) into the muscle every 3 (three) months. (Patient not taking: Reported on 06/18/2017) 1  mL  Not Taking at Unknown time  . nitrofurantoin, macrocrystal-monohydrate, (MACROBID) 100 MG capsule Take 1 capsule (100 mg total) by mouth 2 (two) times daily. (Patient not taking: Reported on 06/18/2017) 14 capsule 0 Completed Course at Unknown time    Review of Systems  Eyes: Negative for visual disturbance.  Respiratory: Positive for shortness of breath.   Cardiovascular: Positive for chest pain.  Gastrointestinal: Negative for abdominal pain.  Genitourinary: Negative for vaginal bleeding.  Neurological: Negative for headaches.   Physical Exam   Blood pressure 129/82, pulse 69, temperature 98.1 F (36.7 C), resp. rate 14, height 5\' 7"  (1.702 m), weight 145 lb (65.8 kg), SpO2 96 %.  Patient Vitals for the past 24 hrs:  BP Temp Temp src Pulse Resp SpO2 Height Weight  06/18/17 1108 132/88 - - 63 18 - - -  06/18/17 1050 - - - - 16 - - -  06/18/17 1030 131/83 - - 74 - - - -  06/18/17 1015 127/76 - - 72 - - - -  06/18/17 1000 134/83 - - 69 - - - -  06/18/17 0944 129/82 - - 69 - - - -  06/18/17 0930 (!) 135/95 - - 68 - - - -  06/18/17 0915 127/80 - -  75 - - - -  06/18/17 0910 - - - - - 96 % - -  06/18/17 0909 - - - - - 96 % - -  06/18/17 0905 131/72 - - 69 - 100 % - -  06/18/17 0904 - - - - - 99 % - -  06/18/17 0842 131/87 98.1 F (36.7 C) - 76 14 100 % - -  06/18/17 0730 131/87 - - - - - - -  06/18/17 0715 126/88 - - - - - - -  06/18/17 0700 120/67 - - - - - - -  06/18/17 0645 (!) 140/91 - - - - - - -  06/18/17 0630 (!) 148/93 - - - - - - -  06/18/17 0615 (!) 158/86 - - - - - - -  06/18/17 0600 (!) 130/91 - - - - - - -  06/18/17 0545 135/90 - - - - - - -  06/18/17 0530 129/81 - - 76 - 100 % - -  06/18/17 0512 (!) 143/77 98.1 F (36.7 C) Oral 82 14 99 % - -  06/18/17 0152 (!) 145/88 98.1 F (36.7 C) Oral 67 20 100 % - -  06/18/17 0134 - - - - - - 5\' 7"  (1.702 m) 145 lb (65.8 kg)    Physical Exam  Constitutional: She is oriented to person, place, and time. She appears  well-developed and well-nourished. No distress.  HENT:  Head: Normocephalic and atraumatic.  Neck: Normal range of motion.  Cardiovascular: Normal rate, regular rhythm and normal heart sounds.   Respiratory: Effort normal and breath sounds normal. No respiratory distress. She has no wheezes. She has no rales.  GI: Soft. She exhibits no distension. There is no tenderness.  gravid  Musculoskeletal: Normal range of motion. She exhibits no edema.  Neurological: She is alert and oriented to person, place, and time.  Skin: Skin is warm and dry.  Psychiatric: She has a normal mood and affect.  EFM: 125 bpm, mod variability, + accels, no decels Toco: none  Results for orders placed or performed during the hospital encounter of 06/18/17 (from the past 24 hour(s))  Urinalysis, Routine w reflex microscopic     Status: Abnormal   Collection Time: 06/18/17  1:45 AM  Result Value Ref Range   Color, Urine YELLOW YELLOW   APPearance HAZY (A) CLEAR   Specific Gravity, Urine 1.009 1.005 - 1.030   pH 7.0 5.0 - 8.0   Glucose, UA NEGATIVE NEGATIVE mg/dL   Hgb urine dipstick NEGATIVE NEGATIVE   Bilirubin Urine NEGATIVE NEGATIVE   Ketones, ur NEGATIVE NEGATIVE mg/dL   Protein, ur NEGATIVE NEGATIVE mg/dL   Nitrite POSITIVE (A) NEGATIVE   Leukocytes, UA LARGE (A) NEGATIVE   RBC / HPF 0-5 0 - 5 RBC/hpf   WBC, UA 6-30 0 - 5 WBC/hpf   Bacteria, UA RARE (A) NONE SEEN   Squamous Epithelial / LPF 0-5 (A) NONE SEEN  Basic metabolic panel     Status: Abnormal   Collection Time: 06/18/17  1:59 AM  Result Value Ref Range   Sodium 134 (L) 135 - 145 mmol/L   Potassium 4.1 3.5 - 5.1 mmol/L   Chloride 104 101 - 111 mmol/L   CO2 19 (L) 22 - 32 mmol/L   Glucose, Bld 81 65 - 99 mg/dL   BUN 10 6 - 20 mg/dL   Creatinine, Ser 1.61 0.44 - 1.00 mg/dL   Calcium 9.7 8.9 - 09.6 mg/dL   GFR calc  non Af Amer >60 >60 mL/min   GFR calc Af Amer >60 >60 mL/min   Anion gap 11 5 - 15  CBC     Status: Abnormal   Collection  Time: 06/18/17  1:59 AM  Result Value Ref Range   WBC 11.1 (H) 4.0 - 10.5 K/uL   RBC 4.23 3.87 - 5.11 MIL/uL   Hemoglobin 13.6 12.0 - 15.0 g/dL   HCT 56.2 13.0 - 86.5 %   MCV 90.8 78.0 - 100.0 fL   MCH 32.2 26.0 - 34.0 pg   MCHC 35.4 30.0 - 36.0 g/dL   RDW 78.4 69.6 - 29.5 %   Platelets 254 150 - 400 K/uL  Lactate dehydrogenase     Status: None   Collection Time: 06/18/17  7:21 AM  Result Value Ref Range   LDH 181 98 - 192 U/L  Hepatic function panel     Status: Abnormal   Collection Time: 06/18/17  7:21 AM  Result Value Ref Range   Total Protein 6.8 6.5 - 8.1 g/dL   Albumin 3.4 (L) 3.5 - 5.0 g/dL   AST 39 15 - 41 U/L   ALT 33 14 - 54 U/L   Alkaline Phosphatase 281 (H) 38 - 126 U/L   Total Bilirubin 0.6 0.3 - 1.2 mg/dL   Bilirubin, Direct 0.1 0.1 - 0.5 mg/dL   Indirect Bilirubin 0.5 0.3 - 0.9 mg/dL    MAU Course  Procedures  MDM Labs ordered and reviewed. No severe range BPs. No evidence of preeclampsia. Will arrange transfer of care to [facility] for remainder of pregnancy and schedule [place] with antenatal testing. Discussed need for delivery at 37 weeks. Start baby ASA now. Stable for discharge home.   Assessment and Plan   1. [redacted] weeks gestation of pregnancy   2. Secondary hypertension   3. Shortness of breath   4. Other chest pain   5. Pregnancy-induced hypertension in third trimester   6. NST (non-stress test) reactive    Discharge home Follow up with [facility] on 06/20/17-clinic to call- message sent to admin pool Preeclampsia/return precautions Rx baby ASA  Allergies as of 06/18/2017      Reactions   Sulfa Antibiotics Rash   Zithromax [azithromycin] Rash      Medication List    STOP taking these medications   medroxyPROGESTERone 150 MG/ML injection Commonly known as:  [name]   nitrofurantoin (macrocrystal-monohydrate) 100 MG capsule Commonly known as:  MACROBID     TAKE these medications   aspirin EC 81 MG tablet Take 1 tablet (81 mg total) by mouth  daily.   prenatal multivitamin Tabs tablet Take 1 tablet by mouth daily at 12 noon.      Donette Larry, CNM 06/18/2017, 9:58 AM

## 2017-06-18 NOTE — Discharge Instructions (Signed)
Hypertension During Pregnancy °Hypertension, commonly called high blood pressure, is when the force of blood pumping through your arteries is too strong. Arteries are blood vessels that carry blood from the heart throughout the body. Hypertension during pregnancy can cause problems for you and your baby. Your baby may be born early (prematurely) or may not weigh as much as he or she should at birth. Very bad cases of hypertension during pregnancy can be life-threatening. °Different types of hypertension can occur during pregnancy. These include: °· Chronic hypertension. This happens when: °? You have hypertension before pregnancy and it continues during pregnancy. °? You develop hypertension before you are [redacted] weeks pregnant, and it continues during pregnancy. °· Gestational hypertension. This is hypertension that develops after the 20th week of pregnancy. °· Preeclampsia, also called toxemia of pregnancy. This is a very serious type of hypertension that develops only during pregnancy. It affects the whole body, and it can be very dangerous for you and your baby. ° °Gestational hypertension and preeclampsia usually go away within 6 weeks after your baby is born. Women who have hypertension during pregnancy have a greater chance of developing hypertension later in life or during future pregnancies. °What are the causes? °The exact cause of hypertension is not known. °What increases the risk? °There are certain factors that make it more likely for you to develop hypertension during pregnancy. These include: °· Having hypertension during a previous pregnancy or prior to pregnancy. °· Being overweight. °· Being older than age 40. °· Being pregnant for the first time or being pregnant with more than one baby. °· Becoming pregnant using fertilization methods such as IVF (in vitro fertilization). °· Having diabetes, kidney problems, or systemic lupus erythematosus. °· Having a family history of hypertension. ° °What are the  signs or symptoms? °Chronic hypertension and gestational hypertension rarely cause symptoms. Preeclampsia causes symptoms, which may include: °· Increased protein in your urine. Your health care provider will check for this at every visit before you give birth (prenatal visit). °· Severe headaches. °· Sudden weight gain. °· Swelling of the hands, face, legs, and feet. °· Nausea and vomiting. °· Vision problems, such as blurred or double vision. °· Numbness in the face, arms, legs, and feet. °· Dizziness. °· Slurred speech. °· Sensitivity to bright lights. °· Abdominal pain. °· Convulsions. ° °How is this diagnosed? °You may be diagnosed with hypertension during a routine prenatal exam. At each prenatal visit, you may: °· Have a urine test to check for high amounts of protein in your urine. °· Have your blood pressure checked. A blood pressure reading is recorded as two numbers, such as "120 over 80" (or 120/80). The first ("top") number is called the systolic pressure. It is a measure of the pressure in your arteries when your heart beats. The second ("bottom") number is called the diastolic pressure. It is a measure of the pressure in your arteries as your heart relaxes between beats. Blood pressure is measured in a unit called mm Hg. A normal blood pressure reading is: °? Systolic: below 120. °? Diastolic: below 80. ° °The type of hypertension that you are diagnosed with depends on your test results and when your symptoms developed. °· Chronic hypertension is usually diagnosed before 20 weeks of pregnancy. °· Gestational hypertension is usually diagnosed after 20 weeks of pregnancy. °· Hypertension with high amounts of protein in the urine is diagnosed as preeclampsia. °· Blood pressure measurements that stay above 160 systolic, or above 110 diastolic, are   signs of severe preeclampsia. ° °How is this treated? °Treatment for hypertension during pregnancy varies depending on the type of hypertension you have and how  serious it is. °· If you take medicines called ACE inhibitors to treat chronic hypertension, you may need to switch medicines. ACE inhibitors should not be taken during pregnancy. °· If you have gestational hypertension, you may need to take blood pressure medicine. °· If you are at risk for preeclampsia, your health care provider may recommend that you take a low-dose aspirin every day to prevent high blood pressure during your pregnancy. °· If you have severe preeclampsia, you may need to be hospitalized so you and your baby can be monitored closely. You may also need to take medicine (magnesium sulfate) to prevent seizures and to lower blood pressure. This medicine may be given as an injection or through an IV tube. °· In some cases, if your condition gets worse, you may need to deliver your baby early. ° °Follow these instructions at home: °Eating and drinking °· Drink enough fluid to keep your urine clear or pale yellow. °· Eat a healthy diet that is low in salt (sodium). Do not add salt to your food. Check food labels to see how much sodium a food or beverage contains. °Lifestyle °· Do not use any products that contain nicotine or tobacco, such as cigarettes and e-cigarettes. If you need help quitting, ask your health care provider. °· Do not use alcohol. °· Avoid caffeine. °· Avoid stress as much as possible. Rest and get plenty of sleep. °General instructions °· Take over-the-counter and prescription medicines only as told by your health care provider. °· While lying down, lie on your left side. This keeps pressure off your baby. °· While sitting or lying down, raise (elevate) your feet. Try putting some pillows under your lower legs. °· Exercise regularly. Ask your health care provider what kinds of exercise are best for you. °· Keep all prenatal and follow-up visits as told by your health care provider. This is important. °Contact a health care provider if: °· You have symptoms that your health care  provider told you may require more treatment or monitoring, such as: °? Fever. °? Vomiting. °? Headache. °Get help right away if: °· You have severe abdominal pain or vomiting that does not get better with treatment. °· You suddenly develop swelling in your hands, ankles, or face. °· You gain 4 lbs (1.8 kg) or more in 1 week. °· You develop vaginal bleeding, or you have blood in your urine. °· You do not feel your baby moving as much as usual. °· You have blurred or double vision. °· You have muscle twitching or sudden tightening (spasms). °· You have shortness of breath. °· Your lips or fingernails turn blue. °This information is not intended to replace advice given to you by your health care provider. Make sure you discuss any questions you have with your health care provider. °Document Released: 07/13/2011 Document Revised: 05/14/2016 Document Reviewed: 04/09/2016 °[employer] Interactive Patient Education © 2018 [employer] ° °

## 2017-06-18 NOTE — ED Notes (Signed)
Rapid OB called now that pt has a room.

## 2017-06-18 NOTE — ED Provider Notes (Signed)
[facility] DEPT Provider Note   CSN: [id number] Arrival date & time: 06/18/17  0126     History   Chief Complaint Chief Complaint  Patient presents with  . Shortness of Breath  . Pregnant    HPI Sonya Green is a 26 y.o. female who is currently 133 weeks pregnant who presents with SOB and CP x 1 day. Patient states that symptoms began yesterday and worsened last night prompting ED visit. Patient states that she feels "almost as if I'm hyperventilating." On ED arrival, she states that CP has resolved. Patient reports that when she was experiencing CP she was having some midsternal discomfort where she felt "like things were pressing up."  She reports that symptoms are not worsened with exertion or with deep inspiration. She states that symptoms were worsened when she was attempting to lay down to go to sleep, preventing her from being able to sleep. Patient also reports feeling lightheaded and generalized weakness. Patient states that she felt like she was going to pass out. She denies any LOC. She denies any room spinning sensation. Patient reports she is currently [redacted] weeks pregnant. She denies any complications with the pregnancy. Patient denies any fevers/chills, cough, congestion, abdominal pain, dysuria, hematuria.    [facility]N: [facility]   The history is provided by the patient.    Past Medical History:  Diagnosis Date  . Jaundice of newborn   . Migraines     Patient Active Problem List   Diagnosis Date Noted  . Supervision of normal pregnancy, antepartum 03/09/2017  . Birth control counseling 06/12/2015    Past Surgical History:  Procedure Laterality Date  . WISDOM TOOTH EXTRACTION      OB History    Gravida Para Term Preterm AB Living   1             SAB TAB Ectopic Multiple Live Births                   Home Medications    Prior to Admission medications   Medication Sig Start Date End Date Taking? Authorizing Provider  Prenatal Vit-Fe  Fumarate-FA (PRENATAL MULTIVITAMIN) TABS tablet Take 1 tablet by mouth daily at 12 noon.   Yes [provider]  medroxyPROGESTERone ([name]) 150 MG/ML injection Inject 1 mL (150 mg total) into the muscle every 3 (three) months. Patient not taking: Reported on 06/18/2017 06/12/15   Veryl Speakalone, Gregory D, FNP  nitrofurantoin, macrocrystal-monohydrate, (MACROBID) 100 MG capsule Take 1 capsule (100 mg total) by mouth 2 (two) times daily. Patient not taking: Reported on 06/18/2017 04/02/16   Riki SheerYoung, Michelle G, PA-C    Family History Family History  Problem Relation Age of Onset  . Healthy Mother   . Arthritis Maternal Grandmother   . Stroke Maternal Grandmother   . Diabetes Maternal Grandmother   . Lung cancer Maternal Grandfather   . Diabetes Paternal Grandmother     Social History Social History  Substance Use Topics  . Smoking status: Former Games developermoker  . Smokeless tobacco: Never Used  . Alcohol use Yes     Comment: Occasionally     Allergies   Sulfa antibiotics and Zithromax [azithromycin]   Review of Systems Review of Systems  Constitutional: Negative for chills and fever.  HENT: Negative for congestion, rhinorrhea and sore throat.   Eyes: Negative for visual disturbance.  Respiratory: Positive for shortness of breath. Negative for cough.   Cardiovascular: Positive for chest pain.  Gastrointestinal: Negative for abdominal pain, diarrhea, nausea  and vomiting.  Genitourinary: Negative for dysuria and hematuria.  Musculoskeletal: Negative for back pain.  Neurological: Positive for light-headedness. Negative for dizziness, weakness, numbness and headaches.  All other systems reviewed and are negative.    Physical Exam Updated Vital Signs BP 129/82   Pulse 69   Temp 98.1 F (36.7 C)   Resp 14   Ht 5\' 7"  (1.702 m)   Wt 65.8 kg (145 lb)   SpO2 96%   BMI 22.71 kg/m   Physical Exam  Constitutional: She is oriented to person, place, and time. She appears  well-developed and well-nourished.  Appears uncomfortable but no acute distress   HENT:  Head: Normocephalic and atraumatic.  Mouth/Throat: Oropharynx is clear and moist and mucous membranes are normal.  Eyes: Pupils are equal, round, and reactive to light. Conjunctivae, EOM and lids are normal.  Neck: Full passive range of motion without pain.  Cardiovascular: Normal rate, regular rhythm, normal heart sounds and normal pulses.  Exam reveals no gallop and no friction rub.   No murmur heard. Pulmonary/Chest: Effort normal and breath sounds normal.  No evidence of respiratory distress. Able to speak in full sentences without difficulty.  Abdominal: Soft. Normal appearance. There is no tenderness. There is CVA tenderness. There is no rigidity and no guarding.  Gravid abdomen. Bilateral CVA tenderness, right greater than left  Musculoskeletal: Normal range of motion.  Mild non-pitting edema noted to the bilateral ankles. No edema noted to bilateral legs. No overlying warmth or erythema. BLE are symmetric in appearance.   Neurological: She is alert and oriented to person, place, and time.  Skin: Skin is warm and dry. Capillary refill takes less than 2 seconds.  Psychiatric: She has a normal mood and affect. Her speech is normal.  Nursing note and vitals reviewed.    ED Treatments / Results  Labs (all labs ordered are listed, but only abnormal results are displayed) Labs Reviewed  BASIC METABOLIC PANEL - Abnormal; Notable for the following:       Result Value   Sodium 134 (*)    CO2 19 (*)    All other components within normal limits  CBC - Abnormal; Notable for the following:    WBC 11.1 (*)    All other components within normal limits  URINALYSIS, ROUTINE W REFLEX MICROSCOPIC - Abnormal; Notable for the following:    APPearance HAZY (*)    Nitrite POSITIVE (*)    Leukocytes, UA LARGE (*)    Bacteria, UA RARE (*)    Squamous Epithelial / LPF 0-5 (*)    All other components within  normal limits  HEPATIC FUNCTION PANEL - Abnormal; Notable for the following:    Albumin 3.4 (*)    Alkaline Phosphatase 281 (*)    All other components within normal limits  LACTATE DEHYDROGENASE  PROTEIN / CREATININE RATIO, URINE    EKG  EKG Interpretation  Date/Time:  Saturday June 18 2017 01:34:43 EDT Ventricular Rate:  82 PR Interval:  130 QRS Duration: 78 QT Interval:  372 QTC Calculation: 434 R Axis:   89 Text Interpretation:  Normal sinus rhythm Normal ECG Early repolarization pattern No old tracing to compare Confirmed by Devoria Albe ([id number]) on 06/18/2017 5:56:44 AM       Radiology Ct Angio Chest Pe W And/or Wo Contrast  Result Date: 06/18/2017 CLINICAL DATA:  Patient [redacted] weeks pregnant with chest pain, shortness of breath and dizziness since yesterday. Evaluate for pulmonary embolism. Patient double shielded for this examination.  EXAM: CT ANGIOGRAPHY CHEST WITH CONTRAST TECHNIQUE: Multidetector CT imaging of the chest was performed using the standard protocol during bolus administration of intravenous contrast. Multiplanar CT image reconstructions and MIPs were obtained to evaluate the vascular anatomy. CONTRAST:  80 mL Isovue 370 IV. COMPARISON:  CT abdomen/ pelvis 07/26/2015 FINDINGS: Cardiovascular: Heart is normal in size. Thoracic aorta and takeoff of great vessels normal. Pulmonary arterial system well opacified without evidence of emboli. Mediastinum/Nodes: No evidence of mediastinal or hilar adenopathy. Remaining mediastinal structures are within normal. Lungs/Pleura: Lungs are adequately inflated without airspace consolidation or effusion. Airways are normal. Upper Abdomen: Within normal. Musculoskeletal: Within normal. Review of the MIP images confirms the above findings. IMPRESSION: No acute cardiopulmonary disease. No evidence of pulmonary embolism. Electronically Signed   By: Elberta Fortis M.D.   On: 06/18/2017 07:36    Procedures Procedures (including critical  care time)  Medications Ordered in ED Medications  hydrALAZINE (APRESOLINE) 20 MG/ML injection (not administered)  sodium chloride 0.9 % bolus 500 mL (0 mLs Intravenous Stopped 06/18/17 0808)  hydrALAZINE (APRESOLINE) injection 5 mg (5 mg Intravenous Given 06/18/17 0732)  iopamidol (ISOVUE-370) 76 % injection (80 mLs  Contrast Given 06/18/17 0651)     Initial Impression / Assessment and Plan / ED Course  I have reviewed the triage vital signs and the nursing notes.  Pertinent labs & imaging results that were available during my care of the patient were reviewed by me and considered in my medical decision making (see chart for details).     26 year old female who is currently [redacted] weeks pregnant who presents with shortness of breath and chest pain that began yesterday. Worse when laying down. No effect with exertion or deep inspiration. Also with some complaints of lightheadedness, nausea/vomiting. Patient is afebrile, non-toxic appearing. Vitals reviewed. Patient is slightly hypertensive. Consider PE versus IVC compression versus pyelonephritis. Also consider preeclampsia given patient's hypertension. Initial labs ordered in triage including BMP, CBC, UA, EKG. Will order additional hepatic function panel, LDH for further evaluation of pre-eclampsia. Patient given hydralazine for control blood pressure. OB RN paged at initial triage evaluation. Patient was hooked up to fetal monitor. Good fetal heart tones. Discussed patient with Dr. Lynelle Doctor. Will plan to obtain CTA of chest given complaints.   Discussed with OB RN who has talked with Dr. Despina Hidden, who would like patient transferred [facility] hospital for workup for preeclampsia. Will complete CTA before patient is transferred to [facility].   CTA reviewed. Negative for any acute PE. Discussed results with patient and mom. Patient is stable for discharge for further workup at the [facility].   Final Clinical Impressions(s) / ED Diagnoses   Final  diagnoses:  [redacted] weeks gestation of pregnancy  Secondary hypertension  Shortness of breath  Other chest pain    New Prescriptions Current Discharge Medication List       Rosana Hoes 06/18/17 1610    Devoria Albe, MD 06/19/17 [phone]

## 2017-06-18 NOTE — ED Triage Notes (Signed)
Pt reports SOB, dizziness since yesterday. Pt is [redacted] wks pregnant.

## 2017-06-18 NOTE — Progress Notes (Signed)
[facility] patient G1P0, [id number], presented to Ed with SOB x 1 day with worsening tonight. C/o chest pain. Denies LOF or vaginal bleeding, denies contractions. States she feels some uterine tightening at present. EFM places, assessing.

## 2017-06-18 NOTE — ED Provider Notes (Signed)
Patient is G1 P0, approximately [redacted] weeks pregnant. She states she's had a normal pregnancy so far. She states starting on August 9 she has been having a lower chest discomfort that she states feels like "he is pushing up underneath my chest". She states it's usually in the evenings and last for several hours. She also has some shortness of breath. Patient reports her last OB visit her blood pressure was a little bit high.  Patient is alert and appears anxious. Her blood pressure was high in triage and then got a little bit higher and she was brought back to her room. Patient does not appear to be short of breath. Her chest is nontender to palpation and she indicates her pain was not in the anterior chest but more underneath in the diaphragm area.  Patient has been seen by the rapid response nurse and Dr. Despina HiddenEure [facility] He wants the patient to be transferred to their facility for evaluation for preeclampsia. Additional blood work was ordered. Due to her being pregnant and having chest pain and shortness of breath CTA  Of the chest was done.  Medical screening examination/treatment/procedure(s) were conducted as a shared visit with non-physician practitioner(s) and myself.  I personally evaluated the patient during the encounter.   EKG Interpretation  Date/Time:  Saturday June 18 2017 01:34:43 EDT Ventricular Rate:  82 PR Interval:  130 QRS Duration: 78 QT Interval:  372 QTC Calculation: 434 R Axis:   89 Text Interpretation:  Normal sinus rhythm Normal ECG Early repolarization pattern No old tracing to compare Confirmed by Devoria AlbeKnapp, Mattalyn Anderegg ([id number]) on 06/18/2017 5:56:44 AM       Devoria AlbeIva Jesalyn Finazzo, MD, Concha PyoFACEP    Giancarlo Askren, MD 06/18/17 [phone]

## 2017-06-18 NOTE — Progress Notes (Signed)
Tct: Dr.Eure, updated on patient. Reassuring FHT. Updated on BP and patient c/o SOB and CP. 02 sats 99-100%. ED provider to order chest CT and patient to be transferred to [facility] for further evaluation. Patient OB cleared at present, awaiting CT and transfer. ED provider aware.

## 2017-06-22 ENCOUNTER — Other Ambulatory Visit: Payer: Self-pay | Admitting: *Deleted

## 2017-06-22 ENCOUNTER — Telehealth: Payer: Self-pay | Admitting: *Deleted

## 2017-06-22 ENCOUNTER — Other Ambulatory Visit: Payer: Managed Care, Other (non HMO)

## 2017-06-22 DIAGNOSIS — O139 Gestational [pregnancy-induced] hypertension without significant proteinuria, unspecified trimester: Secondary | ICD-10-CM

## 2017-06-22 NOTE — Telephone Encounter (Signed)
Called pt and left message on her personal voicemail stating that I am calling regarding an appt needed for this week. We had left a message earlier today about appt @ 1440 for today. We realize that she may not have received the message with enough time to make the appt. We have rescheduled the appt to Friday 8/17 @ 0800. She may call back if she has questions.

## 2017-06-23 ENCOUNTER — Telehealth: Payer: Self-pay | Admitting: Obstetrics and Gynecology

## 2017-06-23 NOTE — Telephone Encounter (Signed)
Called patient to reschedule her appointment she missed on 08/15. Informed patient after speaking with her on the 15th about an appointment that she was going to come to, she really needed to come on 08/17. Had to leave a voicemail about this appointment.

## 2017-06-24 ENCOUNTER — Ambulatory Visit (INDEPENDENT_AMBULATORY_CARE_PROVIDER_SITE_OTHER): Payer: Managed Care, Other (non HMO) | Admitting: *Deleted

## 2017-06-24 ENCOUNTER — Ambulatory Visit: Payer: Self-pay

## 2017-06-24 VITALS — BP 134/86 | HR 76

## 2017-06-24 DIAGNOSIS — O133 Gestational [pregnancy-induced] hypertension without significant proteinuria, third trimester: Secondary | ICD-10-CM | POA: Diagnosis not present

## 2017-06-24 LAB — POCT URINALYSIS DIP (DEVICE)
BILIRUBIN URINE: NEGATIVE
GLUCOSE, UA: NEGATIVE mg/dL
KETONES UR: NEGATIVE mg/dL
Nitrite: NEGATIVE
PROTEIN: 30 mg/dL — AB
Specific Gravity, Urine: 1.015 (ref 1.005–1.030)
Urobilinogen, UA: 0.2 mg/dL (ref 0.0–1.0)
pH: 6.5 (ref 5.0–8.0)

## 2017-06-24 NOTE — Progress Notes (Signed)
Pt has been receiving prenatal care @ [facility]. She was seen @ [facility] on 8/11 and had elevated BP's - now beginning fetal testing. She denies H/A or visual disturbances.  She is scheduled for [place] growth and BPP @ [facility] on 8/23.  New Ob appt (transfer of care) w/NST/BPP in office on 8/28.  Pt informed that the ultrasound is considered a limited OB ultrasound and is not intended to be a complete ultrasound exam.  Patient also informed that the ultrasound is not being completed with the intent of assessing for fetal or placental anomalies or any pelvic abnormalities.  Explained that the purpose of today's ultrasound is to assess for presentation, BPP and amniotic fluid volume.  Patient acknowledges the purpose of the exam and the limitations of the study.

## 2017-06-30 ENCOUNTER — Other Ambulatory Visit: Payer: Self-pay | Admitting: Obstetrics and Gynecology

## 2017-06-30 ENCOUNTER — Ambulatory Visit (HOSPITAL_COMMUNITY)
Admission: RE | Admit: 2017-06-30 | Discharge: 2017-06-30 | Disposition: A | Discharge status: Home / Self Care | Payer: Managed Care, Other (non HMO) | Source: Ambulatory Visit | Attending: Certified Nurse Midwife | Admitting: Certified Nurse Midwife

## 2017-06-30 ENCOUNTER — Encounter (HOSPITAL_COMMUNITY): Payer: Self-pay

## 2017-06-30 DIAGNOSIS — Z363 Encounter for antenatal screening for malformations: Secondary | ICD-10-CM | POA: Diagnosis not present

## 2017-06-30 DIAGNOSIS — O365930 Maternal care for other known or suspected poor fetal growth, third trimester, not applicable or unspecified: Secondary | ICD-10-CM | POA: Diagnosis not present

## 2017-06-30 DIAGNOSIS — O133 Gestational [pregnancy-induced] hypertension without significant proteinuria, third trimester: Secondary | ICD-10-CM

## 2017-06-30 DIAGNOSIS — O139 Gestational [pregnancy-induced] hypertension without significant proteinuria, unspecified trimester: Secondary | ICD-10-CM

## 2017-06-30 DIAGNOSIS — Z3A35 35 weeks gestation of pregnancy: Secondary | ICD-10-CM

## 2017-07-01 ENCOUNTER — Encounter: Payer: Self-pay | Admitting: Obstetrics and Gynecology

## 2017-07-01 DIAGNOSIS — O283 Abnormal ultrasonic finding on antenatal screening of mother: Secondary | ICD-10-CM | POA: Insufficient documentation

## 2017-07-01 DIAGNOSIS — O2340 Unspecified infection of urinary tract in pregnancy, unspecified trimester: Secondary | ICD-10-CM | POA: Insufficient documentation

## 2017-07-05 ENCOUNTER — Ambulatory Visit (INDEPENDENT_AMBULATORY_CARE_PROVIDER_SITE_OTHER): Payer: Managed Care, Other (non HMO) | Admitting: *Deleted

## 2017-07-05 ENCOUNTER — Encounter: Payer: Managed Care, Other (non HMO) | Admitting: Obstetrics and Gynecology

## 2017-07-05 ENCOUNTER — Other Ambulatory Visit (HOSPITAL_COMMUNITY)
Admission: RE | Admit: 2017-07-05 | Discharge: 2017-07-05 | Disposition: A | Discharge status: Home / Self Care | Payer: Managed Care, Other (non HMO) | Source: Ambulatory Visit | Attending: Obstetrics and Gynecology | Admitting: Obstetrics and Gynecology

## 2017-07-05 ENCOUNTER — Encounter: Payer: Self-pay | Admitting: *Deleted

## 2017-07-05 ENCOUNTER — Ambulatory Visit (INDEPENDENT_AMBULATORY_CARE_PROVIDER_SITE_OTHER): Payer: Managed Care, Other (non HMO) | Admitting: Obstetrics and Gynecology

## 2017-07-05 ENCOUNTER — Ambulatory Visit: Payer: Self-pay

## 2017-07-05 VITALS — BP 141/90 | HR 81 | Wt 163.1 lb

## 2017-07-05 DIAGNOSIS — O0993 Supervision of high risk pregnancy, unspecified, third trimester: Secondary | ICD-10-CM | POA: Diagnosis not present

## 2017-07-05 DIAGNOSIS — O133 Gestational [pregnancy-induced] hypertension without significant proteinuria, third trimester: Secondary | ICD-10-CM

## 2017-07-05 DIAGNOSIS — O2343 Unspecified infection of urinary tract in pregnancy, third trimester: Secondary | ICD-10-CM

## 2017-07-05 DIAGNOSIS — O283 Abnormal ultrasonic finding on antenatal screening of mother: Secondary | ICD-10-CM | POA: Diagnosis not present

## 2017-07-05 LAB — POCT URINALYSIS DIP (DEVICE)
Glucose, UA: NEGATIVE mg/dL
Glucose, UA: NEGATIVE mg/dL
HGB URINE DIPSTICK: NEGATIVE
HGB URINE DIPSTICK: NEGATIVE
NITRITE: NEGATIVE
Nitrite: NEGATIVE
PH: 6.5 (ref 5.0–8.0)
Protein, ur: >=300 mg/dL — AB
Protein, ur: >=300 mg/dL — AB
SPECIFIC GRAVITY, URINE: 1.025 (ref 1.005–1.030)
Specific Gravity, Urine: 1.02 (ref 1.005–1.030)
Urobilinogen, UA: 1 mg/dL (ref 0.0–1.0)
Urobilinogen, UA: 1 mg/dL (ref 0.0–1.0)
pH: 6.5 (ref 5.0–8.0)

## 2017-07-05 NOTE — Progress Notes (Signed)
New OB Visit Note Date: 07/05/2017 Clinic: [facility]  CC: transfer of care from [facility] for gHTN  Subjective:  Sonya Green is a 26 y.o. G1P0 at [redacted]w[redacted]d being seen today for ongoing prenatal care.  She is currently monitored for the following issues for this high-risk pregnancy and has Supervision of high risk pregnancy, antepartum, third trimester; Gestational hypertension; Small AC ; and UTI in pregnancy on her problem list.  Patient reports no complaints, including no s/s of pre-eclampsia. .   Contractions: Irregular. Vag. Bleeding: None.  Movement: Present. Denies leaking of fluid.   The following portions of the patient's history were reviewed and updated as appropriate: allergies, current medications, past family history, past medical history, past social history, past surgical history and problem list. Problem list updated.  Objective:   Vitals:   07/05/17 1400 07/05/17 1506  BP: (!) 139/92 (!) 141/90  Pulse: 81   Weight: 163 lb 1.6 oz (74 kg)     Fetal Status: Fetal Heart Rate (bpm): NST   Movement: Present     General:  Alert, oriented and cooperative. Patient is in no acute distress.  Skin: Skin is warm and dry. No rash noted.   Cardiovascular: Normal heart rate noted. Normal s1 and s2, no MRGs  Respiratory: Normal respiratory effort, no problems with respiration noted. CTAB  Abdomen: Soft, gravid, appropriate for gestational age. Pain/Pressure: Present     Pelvic:  Cervical exam deferred        Extremities: Normal range of motion.  Edema: None  Mental Status: Normal mood and affect. Normal behavior. Normal judgment and thought content.   Neuro: 2+ brachial  Urinalysis: Urine Protein: 4+ Urine Glucose: Negative  Assessment and Plan:  Pregnancy: G1P0 at [redacted]w[redacted]d  1. Pregnancy-induced hypertension in third trimester rNST today (130 baseline, +accels, no decel, mod var, toco quiet x 3m). BPP 6/8 (-2 for breathing). Likely mild pre-x since has +4  protein. Surveillance labs today and precautions (pre-x and FKC) d/w pt. Repeat BP in 2-3d. IOL for 36/6-37wks - Comprehensive metabolic panel - Protein / Creatinine Ratio, Urine - CBC  2. Supervision of high risk pregnancy, antepartum, third trimester - GC/Chlamydia probe amp ([place])not at [facility] - Strep Gp B NAA - Culture, OB Urine - Comprehensive metabolic panel - Protein / Creatinine Ratio, Urine - CBC  3. Urinary tract infection in mother during third trimester of pregnancy toc today  4. Small AC  Normal dopplers on 8/23, with AFI 13.7, EFW 27%, 2300gm, AC @ 5%  Preterm labor symptoms and general obstetric precautions including but not limited to vaginal bleeding, contractions, leaking of fluid and fetal movement were reviewed in detail with the patient. Please refer to After Visit Summary for other counseling recommendations.  Return in about 2 days (around 07/07/2017) for bp only check;  Also needs PP visit.  IOL on 9/4.Marland Kitchen   M[place]quero Bing, MD

## 2017-07-05 NOTE — Progress Notes (Signed)
[place] for growth and BPP done 8/24.  BPP was 6/8 - pt left MFM prior to NST being done.  Pt states she was called and asked to come back to hospital but did not because she had scheduled appt today.  Pt denies H/A or visual disturbances.  IOL scheduled 9/4 @ 0700.

## 2017-07-05 NOTE — Progress Notes (Signed)

## 2017-07-06 ENCOUNTER — Telehealth (HOSPITAL_COMMUNITY): Payer: Self-pay | Admitting: *Deleted

## 2017-07-06 ENCOUNTER — Encounter: Payer: Self-pay | Admitting: Obstetrics and Gynecology

## 2017-07-06 LAB — COMPREHENSIVE METABOLIC PANEL
A/G RATIO: 1.4 (ref 1.2–2.2)
ALBUMIN: 3.7 g/dL (ref 3.5–5.5)
ALT: 14 IU/L (ref 0–32)
AST: 22 IU/L (ref 0–40)
Alkaline Phosphatase: 366 IU/L — ABNORMAL HIGH (ref 39–117)
BILIRUBIN TOTAL: 0.3 mg/dL (ref 0.0–1.2)
BUN/Creatinine Ratio: 13 (ref 9–23)
BUN: 11 mg/dL (ref 6–20)
CO2: 21 mmol/L (ref 20–29)
Calcium: 9.9 mg/dL (ref 8.7–10.2)
Chloride: 103 mmol/L (ref 96–106)
Creatinine, Ser: 0.84 mg/dL (ref 0.57–1.00)
GFR calc non Af Amer: 98 mL/min/{1.73_m2} (ref >59)
GFR, EST AFRICAN AMERICAN: 113 mL/min/{1.73_m2} (ref >59)
GLOBULIN, TOTAL: 2.7 g/dL (ref 1.5–4.5)
Glucose: 70 mg/dL (ref 65–99)
Potassium: 4.3 mmol/L (ref 3.5–5.2)
Sodium: 138 mmol/L (ref 134–144)
TOTAL PROTEIN: 6.4 g/dL (ref 6.0–8.5)

## 2017-07-06 LAB — PROTEIN / CREATININE RATIO, URINE
Creatinine, Urine: 259.3 mg/dL
PROTEIN UR: 161.6 mg/dL
Protein/Creat Ratio: 623 mg/g creat — ABNORMAL HIGH (ref 0–200)

## 2017-07-06 LAB — CBC
HEMOGLOBIN: 13.5 g/dL (ref 11.1–15.9)
Hematocrit: 39.8 % (ref 34.0–46.6)
MCH: 32 pg (ref 26.6–33.0)
MCHC: 33.9 g/dL (ref 31.5–35.7)
MCV: 94 fL (ref 79–97)
Platelets: 204 10*3/uL (ref 150–379)
RBC: 4.22 x10E6/uL (ref 3.77–5.28)
RDW: 13.2 % (ref 12.3–15.4)
WBC: 7.8 10*3/uL (ref 3.4–10.8)

## 2017-07-06 LAB — GC/CHLAMYDIA PROBE AMP (~~LOC~~) NOT AT ARMC
Chlamydia: NEGATIVE
Neisseria Gonorrhea: NEGATIVE

## 2017-07-06 NOTE — Telephone Encounter (Signed)
Preadmission screen  

## 2017-07-07 ENCOUNTER — Ambulatory Visit (INDEPENDENT_AMBULATORY_CARE_PROVIDER_SITE_OTHER): Payer: Managed Care, Other (non HMO) | Admitting: General Practice

## 2017-07-07 ENCOUNTER — Ambulatory Visit: Payer: Managed Care, Other (non HMO) | Admitting: *Deleted

## 2017-07-07 VITALS — BP 150/91 | HR 63

## 2017-07-07 DIAGNOSIS — O1403 Mild to moderate pre-eclampsia, third trimester: Secondary | ICD-10-CM | POA: Diagnosis not present

## 2017-07-07 DIAGNOSIS — O1493 Unspecified pre-eclampsia, third trimester: Secondary | ICD-10-CM

## 2017-07-07 LAB — STREP GP B NAA: Strep Gp B NAA: NEGATIVE

## 2017-07-07 NOTE — Progress Notes (Signed)
Agree with nursing staff's documentation of this patient's clinic encounter.  Kayanna Mckillop, MD    

## 2017-07-07 NOTE — Progress Notes (Signed)
NST performed today was reviewed and was found to be reactive.  Continue recommended antenatal testing and prenatal care.  IOL already scheduled for 37 weeks, severe preeclampsia precautions advised.  Sonya Green  [name], MD, FACOG Attending Obstetrician & Gynecologist, [facility], [facility]

## 2017-07-07 NOTE — Progress Notes (Signed)
Vitals documented in separate encounter today

## 2017-07-07 NOTE — Progress Notes (Signed)
Patient presents to clinic for BP check. BP is elevated. Pt c/o of mild headache but no visual changes or abd pain. Trace edema BLE. Good movement from baby. Discussed with Dr Macon LargeAnyanwu, orders received. Tylenol 1000 mg given, NST to be done in our office today. BPP scheduled in MFM tomorrow at 2:45pm. Patient is aware of this appointment.

## 2017-07-08 ENCOUNTER — Encounter: Payer: Self-pay | Admitting: *Deleted

## 2017-07-08 ENCOUNTER — Other Ambulatory Visit: Payer: Self-pay | Admitting: Obstetrics & Gynecology

## 2017-07-08 ENCOUNTER — Inpatient Hospital Stay (HOSPITAL_COMMUNITY)
Admission: AD | Admit: 2017-07-08 | Discharge: 2017-07-13 | DRG: 766 | Disposition: A | Discharge status: Home / Self Care | Payer: Managed Care, Other (non HMO) | Source: Ambulatory Visit | Attending: Obstetrics & Gynecology | Admitting: Obstetrics & Gynecology

## 2017-07-08 ENCOUNTER — Encounter (HOSPITAL_COMMUNITY): Payer: Self-pay

## 2017-07-08 ENCOUNTER — Ambulatory Visit (HOSPITAL_COMMUNITY)
Admission: RE | Admit: 2017-07-08 | Discharge: 2017-07-08 | Disposition: A | Discharge status: Home / Self Care | Payer: Managed Care, Other (non HMO) | Source: Ambulatory Visit | Attending: Obstetrics & Gynecology | Admitting: Obstetrics & Gynecology

## 2017-07-08 ENCOUNTER — Other Ambulatory Visit: Payer: Self-pay | Admitting: Advanced Practice Midwife

## 2017-07-08 ENCOUNTER — Encounter (HOSPITAL_COMMUNITY): Payer: Self-pay | Admitting: *Deleted

## 2017-07-08 DIAGNOSIS — O1494 Unspecified pre-eclampsia, complicating childbirth: Secondary | ICD-10-CM | POA: Diagnosis present

## 2017-07-08 DIAGNOSIS — Z3A36 36 weeks gestation of pregnancy: Secondary | ICD-10-CM

## 2017-07-08 DIAGNOSIS — Z7982 Long term (current) use of aspirin: Secondary | ICD-10-CM

## 2017-07-08 DIAGNOSIS — F419 Anxiety disorder, unspecified: Secondary | ICD-10-CM | POA: Diagnosis present

## 2017-07-08 DIAGNOSIS — Z98891 History of uterine scar from previous surgery: Secondary | ICD-10-CM

## 2017-07-08 DIAGNOSIS — O1493 Unspecified pre-eclampsia, third trimester: Secondary | ICD-10-CM

## 2017-07-08 DIAGNOSIS — O1403 Mild to moderate pre-eclampsia, third trimester: Secondary | ICD-10-CM

## 2017-07-08 DIAGNOSIS — O1413 Severe pre-eclampsia, third trimester: Secondary | ICD-10-CM | POA: Diagnosis not present

## 2017-07-08 DIAGNOSIS — O1410 Severe pre-eclampsia, unspecified trimester: Secondary | ICD-10-CM | POA: Diagnosis present

## 2017-07-08 DIAGNOSIS — O1414 Severe pre-eclampsia complicating childbirth: Principal | ICD-10-CM | POA: Diagnosis present

## 2017-07-08 DIAGNOSIS — O99344 Other mental disorders complicating childbirth: Secondary | ICD-10-CM | POA: Diagnosis present

## 2017-07-08 DIAGNOSIS — O0993 Supervision of high risk pregnancy, unspecified, third trimester: Secondary | ICD-10-CM

## 2017-07-08 LAB — CBC
HCT: 41.2 % (ref 36.0–46.0)
Hemoglobin: 14.7 g/dL (ref 12.0–15.0)
MCH: 32.8 pg (ref 26.0–34.0)
MCHC: 35.7 g/dL (ref 30.0–36.0)
MCV: 92 fL (ref 78.0–100.0)
Platelets: 203 10*3/uL (ref 150–400)
RBC: 4.48 MIL/uL (ref 3.87–5.11)
RDW: 12.8 % (ref 11.5–15.5)
WBC: 13.5 10*3/uL — ABNORMAL HIGH (ref 4.0–10.5)

## 2017-07-08 LAB — COMPREHENSIVE METABOLIC PANEL
ALBUMIN: 3.3 g/dL — AB (ref 3.5–5.0)
ALK PHOS: 422 U/L — AB (ref 38–126)
ALT: 15 U/L (ref 14–54)
AST: 28 U/L (ref 15–41)
Anion gap: 10 (ref 5–15)
BUN: 12 mg/dL (ref 6–20)
CALCIUM: 9.6 mg/dL (ref 8.9–10.3)
CHLORIDE: 106 mmol/L (ref 101–111)
CO2: 20 mmol/L — AB (ref 22–32)
CREATININE: 0.95 mg/dL (ref 0.44–1.00)
GFR calc Af Amer: >60 mL/min (ref >60)
GFR calc non Af Amer: >60 mL/min (ref >60)
GLUCOSE: 95 mg/dL (ref 65–99)
Potassium: 4.6 mmol/L (ref 3.5–5.1)
SODIUM: 136 mmol/L (ref 135–145)
Total Bilirubin: 0.3 mg/dL (ref 0.3–1.2)
Total Protein: 6.9 g/dL (ref 6.5–8.1)

## 2017-07-08 LAB — DRUG SCREEN, URINE: Drug Screen, Urine: POSITIVE

## 2017-07-08 LAB — URIC ACID: Uric Acid, Serum: 8.4 mg/dL — ABNORMAL HIGH (ref 2.3–6.6)

## 2017-07-08 LAB — HM PAP SMEAR: Pap: NEGATIVE

## 2017-07-08 LAB — GLUCOSE, 1 HOUR: GLUCOSE 1 HOUR: 63

## 2017-07-08 LAB — TYPE AND SCREEN
ABO/RH(D): O POS
Antibody Screen: NEGATIVE

## 2017-07-08 LAB — PROTEIN / CREATININE RATIO, URINE
Creatinine, Urine: 93 mg/dL
PROTEIN CREATININE RATIO: 1.42 mg/mg{creat} — AB (ref 0.00–0.15)
Total Protein, Urine: 132 mg/dL

## 2017-07-08 LAB — AFP, QUAD SCREEN: AFP Quad Interp: NEGATIVE

## 2017-07-08 LAB — LACTATE DEHYDROGENASE: LDH: 233 U/L — ABNORMAL HIGH (ref 98–192)

## 2017-07-08 LAB — CULTURE, OB URINE: Urine Culture, OB: POSITIVE

## 2017-07-08 LAB — ABO/RH: ABO/RH(D): O POS

## 2017-07-08 MED ORDER — SOD CITRATE-CITRIC ACID 500-334 MG/5ML PO SOLN
30.0000 mL | ORAL | Status: DC | PRN
  Administered 2017-07-10: 30 mL via ORAL
  Filled 2017-07-08: qty 15

## 2017-07-08 MED ORDER — ONDANSETRON HCL 4 MG/2ML IJ SOLN
4.0000 mg | Freq: Four times a day (QID) | INTRAMUSCULAR | Status: DC | PRN
  Administered 2017-07-09 – 2017-07-10 (×2): 4 mg via INTRAVENOUS
  Filled 2017-07-08 (×2): qty 2

## 2017-07-08 MED ORDER — FENTANYL CITRATE (PF) 100 MCG/2ML IJ SOLN
100.0000 ug | INTRAMUSCULAR | Status: DC | PRN
  Administered 2017-07-08 – 2017-07-10 (×5): 100 ug via INTRAVENOUS
  Filled 2017-07-08 (×5): qty 2

## 2017-07-08 MED ORDER — LACTATED RINGERS IV SOLN
INTRAVENOUS | Status: DC
  Administered 2017-07-08 – 2017-07-10 (×4): via INTRAVENOUS

## 2017-07-08 MED ORDER — LACTATED RINGERS IV SOLN
500.0000 mL | INTRAVENOUS | Status: DC | PRN
  Administered 2017-07-10 (×2): 200 mL via INTRAVENOUS

## 2017-07-08 MED ORDER — OXYCODONE-ACETAMINOPHEN 5-325 MG PO TABS: 1.0000 | ORAL_TABLET | ORAL | Status: DC | PRN

## 2017-07-08 MED ORDER — MISOPROSTOL 50MCG HALF TABLET
50.0000 ug | ORAL_TABLET | ORAL | Status: DC | PRN
  Administered 2017-07-08 – 2017-07-09 (×3): 50 ug via ORAL
  Filled 2017-07-08 (×3): qty 1

## 2017-07-08 MED ORDER — HYDROXYZINE HCL 50 MG PO TABS
25.0000 mg | ORAL_TABLET | ORAL | Status: DC | PRN
  Filled 2017-07-08: qty 1

## 2017-07-08 MED ORDER — LIDOCAINE HCL (PF) 1 % IJ SOLN: 30.0000 mL | INTRAMUSCULAR | Status: DC | PRN

## 2017-07-08 MED ORDER — ACETAMINOPHEN 325 MG PO TABS
650.0000 mg | ORAL_TABLET | ORAL | Status: DC | PRN
  Administered 2017-07-09: 650 mg via ORAL
  Filled 2017-07-08: qty 2

## 2017-07-08 MED ORDER — TERBUTALINE SULFATE 1 MG/ML IJ SOLN: 0.2500 mg | Freq: Once | INTRAMUSCULAR | Status: DC | PRN

## 2017-07-08 MED ORDER — OXYTOCIN BOLUS FROM INFUSION: 500.0000 mL | Freq: Once | INTRAVENOUS | Status: DC

## 2017-07-08 MED ORDER — HYDRALAZINE HCL 20 MG/ML IJ SOLN: 10.0000 mg | Freq: Once | INTRAMUSCULAR | Status: DC | PRN

## 2017-07-08 MED ORDER — HYDRALAZINE HCL 20 MG/ML IJ SOLN
5.0000 mg | INTRAMUSCULAR | Status: AC | PRN
  Administered 2017-07-08: 10 mg via INTRAVENOUS
  Administered 2017-07-08: 5 mg via INTRAVENOUS
  Filled 2017-07-08: qty 1

## 2017-07-08 MED ORDER — MAGNESIUM SULFATE 40 G IN LACTATED RINGERS - SIMPLE
2.0000 g/h | INTRAVENOUS | Status: DC
  Administered 2017-07-08 – 2017-07-10 (×3): 2 g/h via INTRAVENOUS
  Filled 2017-07-08 (×3): qty 40

## 2017-07-08 MED ORDER — BETAMETHASONE SOD PHOS & ACET 6 (3-3) MG/ML IJ SUSP
12.0000 mg | Freq: Once | INTRAMUSCULAR | Status: AC
  Administered 2017-07-08: 12 mg via INTRAMUSCULAR
  Filled 2017-07-08: qty 2

## 2017-07-08 MED ORDER — HYDROXYZINE HCL 50 MG PO TABS
50.0000 mg | ORAL_TABLET | Freq: Three times a day (TID) | ORAL | Status: DC | PRN
  Administered 2017-07-08 – 2017-07-10 (×4): 50 mg via ORAL
  Filled 2017-07-08 (×5): qty 1

## 2017-07-08 MED ORDER — PROMETHAZINE HCL 25 MG/ML IJ SOLN
12.5000 mg | Freq: Four times a day (QID) | INTRAMUSCULAR | Status: DC | PRN
  Administered 2017-07-08: 12.5 mg via INTRAVENOUS
  Filled 2017-07-08 (×2): qty 1

## 2017-07-08 MED ORDER — MISOPROSTOL 25 MCG QUARTER TABLET: 25.0000 ug | ORAL_TABLET | ORAL | Status: DC | PRN

## 2017-07-08 MED ORDER — MAGNESIUM SULFATE BOLUS VIA INFUSION
4.0000 g | Freq: Once | INTRAVENOUS | Status: AC
  Administered 2017-07-08: 4 g via INTRAVENOUS
  Filled 2017-07-08: qty 500

## 2017-07-08 MED ORDER — BUTORPHANOL TARTRATE 1 MG/ML IJ SOLN
2.0000 mg | Freq: Once | INTRAMUSCULAR | Status: AC
  Administered 2017-07-08: 2 mg via INTRAVENOUS
  Filled 2017-07-08: qty 2

## 2017-07-08 MED ORDER — OXYCODONE-ACETAMINOPHEN 5-325 MG PO TABS: 2.0000 | ORAL_TABLET | ORAL | Status: DC | PRN

## 2017-07-08 MED ORDER — OXYTOCIN 40 UNITS IN LACTATED RINGERS INFUSION - SIMPLE MED: 2.5000 [IU]/h | INTRAVENOUS | Status: DC

## 2017-07-08 MED ORDER — LABETALOL HCL 5 MG/ML IV SOLN: 20.0000 mg | INTRAVENOUS | Status: DC | PRN

## 2017-07-08 MED ORDER — LABETALOL HCL 5 MG/ML IV SOLN
20.0000 mg | INTRAVENOUS | Status: DC | PRN
  Administered 2017-07-10: 20 mg via INTRAVENOUS
  Filled 2017-07-08 (×2): qty 4

## 2017-07-08 NOTE — H&P (Signed)
LABOR ADMISSION HISTORY AND PHYSICAL  Sonya Green is a 26 y.o. female G1P0 with IUP at 3617w4d by LMP presenting for IOL due to dx of pre-e with severe features (BPs, protein) . She reports +FMs, No LOF, no VB, no blurry vision, no headaches, no peripheral edema, and no RUQ pain.  She plans on breast feeding. She requests depo provera for birth control.  Dating: By LMP --->  Estimated Date of Delivery: 08/01/17  Sono:   @[redacted]w[redacted]d , CWD, normal anatomy, cephalic presentation, 2300g, 16%27% EFW  Prenatal History/Complications: Gestational hypertension UTI in pregnancy  Past Medical History: Past Medical History:  Diagnosis Date  . Jaundice of newborn   . Migraines     Past Surgical History: Past Surgical History:  Procedure Laterality Date  . WISDOM TOOTH EXTRACTION      Obstetrical History: OB History    Gravida Para Term Preterm AB Living   1         0   SAB TAB Ectopic Multiple Live Births                  Social History: Social History   Social History  . Marital status: Single    Spouse name: N/A  . Number of children: N/A  . Years of education: 3912   Occupational History  . Housekeeping    Social History Main Topics  . Smoking status: Never Smoker  . Smokeless tobacco: Never Used  . Alcohol use No     Comment: Occasionally  . Drug use: No  . Sexual activity: Not Currently    Birth control/ protection: None   Other Topics Concern  . None   Social History Narrative   Fun: Sports   Denies religious beliefs effecting health care.   Denies abuse and feels safe at home.     Family History: Family History  Problem Relation Age of Onset  . Healthy Mother   . Arthritis Maternal Grandmother   . Stroke Maternal Grandmother   . Diabetes Maternal Grandmother   . Lung cancer Maternal Grandfather   . Diabetes Paternal Grandmother     Allergies: Allergies  Allergen Reactions  . Spinach Anaphylaxis    Throat closes up  . Sulfa Antibiotics Rash  .  Zithromax [Azithromycin] Rash    Prescriptions Prior to Admission  Medication Sig Dispense Refill Last Dose  . aspirin EC 81 MG tablet Take 1 tablet (81 mg total) by mouth daily. 60 tablet 0 07/08/2017 at Unknown time  . Meclizine HCl (BONINE PO) Take 25 mg by mouth daily as needed (nausea).    07/08/2017 at Unknown time  . Prenatal Vit-Fe Fumarate-FA (PRENATAL MULTIVITAMIN) TABS tablet Take 1 tablet by mouth daily at 12 noon.   07/08/2017 at Unknown time     Review of Systems   All systems reviewed and negative except as stated in HPI  Blood pressure (!) 158/103, pulse 76, temperature 97.6 F (36.4 C), temperature source Oral, resp. rate 18, height 5\' 7"  (1.702 m). General appearance: alert, cooperative, appears stated age and mild distress Lungs: normal work of breathing Extremities: Homans sign is negative, no sign of DVT Presentation: cephalic Fetal monitoringBaseline: 130 bpm, Variability: Good {> 6 bpm), Accelerations: Reactive and Decelerations: Absent Uterine activityNone Dilation: Closed Effacement (%): Thick Station: -3 Exam by:: Earlene Plateravis, RN    Prenatal labs: ABO, Rh: --/--/O POS (08/31 1611) Antibody: PENDING (08/31 1611) Rubella: Immune RPR: Nonreactive (07/02 0000)  HBsAg:   neg HIV: Non-reactive (07/02 0000)  GBS:  Negative (08/28 1605)  GTT: wnl  Prenatal Transfer Tool  Maternal Diabetes: No Genetic Screening: Normal Maternal Ultrasounds/Referrals: Normal Fetal Ultrasounds or other Referrals:  Referred to Materal Fetal Medicine  Maternal Substance Abuse:  No Significant Maternal Medications:  Meds include: Other: ASA Significant Maternal Lab Results: Lab values include: Group B Strep negative, Other: elevated urine protein/creatinine  Results for orders placed or performed during the hospital encounter of 07/08/17 (from the past 24 hour(s))  CBC   Collection Time: 07/08/17  4:11 PM  Result Value Ref Range   WBC 13.5 (H) 4.0 - 10.5 K/uL   RBC 4.48 3.87 -  5.11 MIL/uL   Hemoglobin 14.7 12.0 - 15.0 g/dL   HCT 16.1 09.6 - 04.5 %   MCV 92.0 78.0 - 100.0 fL   MCH 32.8 26.0 - 34.0 pg   MCHC 35.7 30.0 - 36.0 g/dL   RDW 40.9 81.1 - 91.4 %   Platelets 203 150 - 400 K/uL  Comprehensive metabolic panel   Collection Time: 07/08/17  4:11 PM  Result Value Ref Range   Sodium 136 135 - 145 mmol/L   Potassium 4.6 3.5 - 5.1 mmol/L   Chloride 106 101 - 111 mmol/L   CO2 20 (L) 22 - 32 mmol/L   Glucose, Bld 95 65 - 99 mg/dL   BUN 12 6 - 20 mg/dL   Creatinine, Ser 7.82 0.44 - 1.00 mg/dL   Calcium 9.6 8.9 - 95.6 mg/dL   Total Protein 6.9 6.5 - 8.1 g/dL   Albumin 3.3 (L) 3.5 - 5.0 g/dL   AST 28 15 - 41 U/L   ALT 15 14 - 54 U/L   Alkaline Phosphatase 422 (H) 38 - 126 U/L   Total Bilirubin 0.3 0.3 - 1.2 mg/dL   GFR calc non Af Amer >60 >60 mL/min   GFR calc Af Amer >60 >60 mL/min   Anion gap 10 5 - 15  Lactate dehydrogenase   Collection Time: 07/08/17  4:11 PM  Result Value Ref Range   LDH 233 (H) 98 - 192 U/L  Uric acid   Collection Time: 07/08/17  4:11 PM  Result Value Ref Range   Uric Acid, Serum 8.4 (H) 2.3 - 6.6 mg/dL  Protein / creatinine ratio, urine   Collection Time: 07/08/17  4:11 PM  Result Value Ref Range   Creatinine, Urine 93.00 mg/dL   Total Protein, Urine 132 mg/dL   Protein Creatinine Ratio 1.42 (H) 0.00 - 0.15 mg/mg[Cre]  Type and screen   Collection Time: 07/08/17  4:11 PM  Result Value Ref Range   ABO/RH(D) O POS    Antibody Screen PENDING    Sample Expiration 07/11/2017     Patient Active Problem List   Diagnosis Date Noted  . Small AC  07/01/2017  . UTI in pregnancy 07/01/2017  . Mild preeclampsia, third trimester 06/18/2017  . Supervision of high risk pregnancy, antepartum, third trimester 03/09/2017    Assessment: Kyra Borgwardt is a 26 y.o. G1P0 at [redacted]w[redacted]d here for IOL for preeclampsia with severe features, including elevated LDH, elevated uric acid, and elevated urine protein/creatinine and blood  pressures.  #Labor: On magnesium, will be given betamethasone.  S/p cytotec 50 mcg PO #Pain: Epidural upon request, IV pain meds #FWB: Cat I  #ID: GBS neg #MOF: breast #MOC:depo #Circ: unsure  Lezlie Octave, MD Family Medicine Resident PGY-1  07/08/2017, 5:33 PM  CNM attestation:  I have seen and examined this patient; I agree with above documentation in  the resident's note.   Sonya Green is a 26 y.o. G1P0 here for IOL due to dx of pre-e with severe features due to BPs, protein.  PE: BP (!) 151/88   Pulse 97   Temp 97.6 F (36.4 C) (Oral)   Resp 18   Ht 5\' 7"  (1.702 m)   Wt 74.8 kg (165 lb)   SpO2 98%   BMI 25.84 kg/m  Gen: calm comfortable, NAD Resp: normal effort, no distress Abd: gravid  ROS, labs, PMH reviewed  Plan: Cx ripening with cytotec Mag sulfate; BMZ IV antihypertensives prn Anticipate SVD  SHAW, KIMBERLY CNM 07/08/2017, 10:51 PM

## 2017-07-08 NOTE — Progress Notes (Signed)
Labor Progress Note  Sonya Green is a 26 y.o. G1P0 at 5622w4d here for IOL for preeclampsia with severe features, including elevated LDH, elevated uric acid, and elevated urine protein/creatinine.  S: Patient seen & examined for progress of labor. Patient is very uncomfortable and has vomited twice. She reports dizziness, light headedness, and that her "eyes feel weird", which she is unable to elaborate on. She is sitting on the side of the bed and has tried to get up despite instructions from the nurse to stay seated or to lay down. Pt has been given phenegan and vistaril for her symptoms, in addition to the magnesium she's receiving for preeclampsia and the cytotec for IOL.  O: BP (!) 151/88   Pulse 97   Temp 97.6 F (36.4 C) (Oral)   Resp 18   Ht 5\' 7"  (1.702 m)   Wt 74.8 kg (165 lb)   SpO2 98%   BMI 25.84 kg/m   On physical exam  FHT: 130bpm, mod var, +accels, no decels TOCO: irregular contractions, patient looks comfortable during contractions  CVE: Dilation: Closed Effacement (%): 50 Station: -2 Presentation: Vertex Exam by:: A. Earna Coderuttle, [facility]   A&P: 26 y.o. G1P0 7422w4d here for  IOL for preeclampsia with severe features.  Labor:oral cytotec 50mcg x 2, continue cytotec q4hrs Fetal Wellbeing:Cat I Pain Control: Epidural available upon request Anticipated VZD:GLOVOD:NSVD  Preeclampsia w/ severe features: started on magnesium 3625ml/hr IV @ 1835 following 4g bolus at @1815 , continue to monitor vital closely. Repeat CMP tomorrow morning.  Preterm labor: received betamethasone x 1  Continue induction  Obie DredgeNelly TWeledji Medical Student 07/08/2017 10:50 PM

## 2017-07-08 NOTE — Progress Notes (Signed)
Pt continues to be anxious even after medication given.  HA & N/V has improved but she states that her eyes dont feel right & she is sitting on side of the bed w/ wet cloth on head.  Monitor tracing maternal HR intermittently d/t pt position.  Pt states that if she leans back she feels worse.  Pt hands are shaky & she is sweaty feeling.

## 2017-07-08 NOTE — Anesthesia Pain Management Evaluation Note (Signed)
  CRNA Pain Management Visit Note  Patient: Sonya Green, 26 y.o., female  "Hello [facility] am a [facility]. We have an anesthesia team available at all times to provide care throughout the hospital, including epidural management and anesthesia for C-section. I don't know your plan for the delivery whether it a natural birth, water birth, IV sedation, nitrous supplementation, doula or epidural, but we want to meet your pain goals."   1.Was your pain managed to your expectations on prior hospitalizations?   No prior hospitalizations  2.What is your expectation for pain management during this hospitalization?     Epidural  3.How can we help you reach that goal? epidural  Record the patient's initial score and the patient's pain goal.   Pain: 2  Pain Goal: 6 [facility] wants you to be able to say your pain was always managed very well.  [name] 07/08/2017

## 2017-07-08 NOTE — ED Notes (Signed)
Report given to Riverview ParkRainey, Charity fundraiserN.  Pt to room 169.

## 2017-07-09 LAB — RPR: RPR: NONREACTIVE

## 2017-07-09 LAB — CBC
HCT: 38.4 % (ref 36.0–46.0)
HEMOGLOBIN: 13.7 g/dL (ref 12.0–15.0)
MCH: 32.8 pg (ref 26.0–34.0)
MCHC: 35.7 g/dL (ref 30.0–36.0)
MCV: 91.9 fL (ref 78.0–100.0)
Platelets: 192 10*3/uL (ref 150–400)
RBC: 4.18 MIL/uL (ref 3.87–5.11)
RDW: 13 % (ref 11.5–15.5)
WBC: 18.8 10*3/uL — ABNORMAL HIGH (ref 4.0–10.5)

## 2017-07-09 LAB — MAGNESIUM: Magnesium: 7 mg/dL (ref 1.7–2.4)

## 2017-07-09 LAB — COMPREHENSIVE METABOLIC PANEL
ALBUMIN: 3.3 g/dL — AB (ref 3.5–5.0)
ALK PHOS: 366 U/L — AB (ref 38–126)
ALT: 15 U/L (ref 14–54)
ANION GAP: 10 (ref 5–15)
AST: 22 U/L (ref 15–41)
BILIRUBIN TOTAL: 0.7 mg/dL (ref 0.3–1.2)
BUN: 13 mg/dL (ref 6–20)
CALCIUM: 8.3 mg/dL — AB (ref 8.9–10.3)
CO2: 21 mmol/L — AB (ref 22–32)
CREATININE: 0.9 mg/dL (ref 0.44–1.00)
Chloride: 102 mmol/L (ref 101–111)
GFR calc Af Amer: >60 mL/min (ref >60)
GFR calc non Af Amer: >60 mL/min (ref >60)
GLUCOSE: 115 mg/dL — AB (ref 65–99)
Potassium: 4.3 mmol/L (ref 3.5–5.1)
SODIUM: 133 mmol/L — AB (ref 135–145)
TOTAL PROTEIN: 6.6 g/dL (ref 6.5–8.1)

## 2017-07-09 MED ORDER — MISOPROSTOL 25 MCG QUARTER TABLET
25.0000 ug | ORAL_TABLET | ORAL | Status: DC
  Administered 2017-07-09 (×4): 25 ug via VAGINAL
  Filled 2017-07-09 (×4): qty 1

## 2017-07-09 MED ORDER — PROMETHAZINE HCL 25 MG/ML IJ SOLN
12.5000 mg | Freq: Once | INTRAMUSCULAR | Status: AC
  Administered 2017-07-09: 12.5 mg via INTRAVENOUS

## 2017-07-09 MED ORDER — BETAMETHASONE SOD PHOS & ACET 6 (3-3) MG/ML IJ SUSP
12.0000 mg | Freq: Once | INTRAMUSCULAR | Status: AC
  Administered 2017-07-09: 12 mg via INTRAMUSCULAR
  Filled 2017-07-09: qty 2

## 2017-07-09 NOTE — Progress Notes (Signed)
LABOR PROGRESS NOTE  Sonya Green is a 26 y.o. G1P0 at 5538w5d  admitted for IOL for severe pre-eclampsia.  Subjective: Patient w/o any concerns at this times other than slight headache. Had episode of large emesis earlier. Denies visual disturbances, SOB  Objective: BP 131/78   Pulse 85   Temp 98.1 F (36.7 C) (Oral)   Resp 18   Ht 5\' 7"  (1.702 m)   Wt 165 lb (74.8 kg)   SpO2 98%   BMI 25.84 kg/m  or  Vitals:   07/09/17 0600 07/09/17 0605 07/09/17 0811 07/09/17 1012  BP:  (!) 142/86 136/87 131/78  Pulse:  83 83 85  Resp: 18 18 18 18   Temp:  97.8 F (36.6 C) 98.1 F (36.7 C)   TempSrc:  Oral Oral   SpO2:      Weight:      Height:        SVE: 1/60%/-2  FHT: baseline rate 120, moderate varibility (5-10 bmp, +acel,  nodecel Toco: irregular  UOP: 1000ml at 0600  Labs: Lab Results  Component Value Date   WBC 18.8 (H) 07/09/2017   HGB 13.7 07/09/2017   HCT 38.4 07/09/2017   MCV 91.9 07/09/2017   PLT 192 07/09/2017   Magnesium level: 7.0  Patient Active Problem List   Diagnosis Date Noted  . Small AC  07/01/2017  . UTI in pregnancy 07/01/2017  . Severe preeclampsia 06/18/2017  . Supervision of high risk pregnancy, antepartum, third trimester 03/09/2017   Assessment / Plan: 26 y.o. G1P0 at 3838w5d here for IOL for pre-eclampsia  PreE: Bps mild range now. On IV mag. Level 7.0. Will place foley to monitor UOP closely. Continue IV mag at 2mg /hr for now Labor: Continue vaginal cytotec for cervical ripening. Placed 2nd vaginal cytotec at 1225 (s/p oral cytotec x 3 prior) Fetal Wellbeing:  Cat I Pain Control:  Per patient's request Anticipated MOD:  SVD  Frederik PearJulie P Degele, MD 07/09/2017, 12:50 PM

## 2017-07-09 NOTE — Progress Notes (Signed)
Labor Progress Note  S: Patient seen & examined for progress of labor. Patient sleeping on my arrival and comfortable.  She has no concerns.  O: BP 136/87   Pulse 83   Temp 98.1 F (36.7 C) (Oral)   Resp 18   Ht 5\' 7"  (1.702 m)   Wt 74.8 kg (165 lb)   SpO2 98%   BMI 25.84 kg/m   FHT: 115bpm, min-mod var, +accels, no decels TOCO: infrequent contractions, patient looks comfortable during contractions  CVE: Dilation: Closed Effacement (%): 50 Cervical Position: Posterior Station: -2 Presentation: Vertex Exam by:: Marcelino DusterMichelle, RN   A&P: 26 y.o. G1P0 1623w5d here for IOL for pre-eclampsia with severe features. BP mildly hypertensive overnight in the 140s/80s, most recent BP of 136/87 at 0811.  Will continue to monitor closely. S/p oral cytotec x 3 and vaginal cytotec x 1.  Will continue vaginal cytotec q4h. Will try to place foley bulb later this morning. Continue induction. Anticipate SVD  Lezlie OctaveAmanda Mistee Soliman, MD [facility] Resident PGY-1 07/09/2017 9:58 AM

## 2017-07-09 NOTE — Progress Notes (Signed)
LABOR PROGRESS NOTE  Sonya Green is a 26 y.o. G1P0 at 61110w5d  admitted for IOL for severe pre-eclampsia.  Subjective: Patient reports pain, would like IV pain medicine.   Objective: BP (!) 158/96   Pulse 100   Temp 98.6 F (37 C) (Oral)   Resp (!) 22   Ht 5\' 7"  (1.702 m)   Wt 165 lb (74.8 kg)   SpO2 98%   BMI 25.84 kg/m  or  Vitals:   07/09/17 2200 07/09/17 2205 07/09/17 2210 07/09/17 2217  BP:    (!) 158/96  Pulse:    100  Resp: (!) 22     Temp:    98.6 F (37 C)  TempSrc:    Oral  SpO2: 98% 96% 98%   Weight:      Height:        SVE: 3/70%/-3  FHT: baseline rate 120, moderate varibility, +acel,  early decel Toco: irregular ctx  UOP: >110750ml/hr  Assessment / Plan: 26 y.o. G1P0 at 33110w5d here for IOL for pre-eclampsia. Pt has been very anxious. IV Fentanyl and IV phenergan given prior to cervical exam. She report  PreE: Bps mild range now. On IV mag. NO signs/sx of toxicity Labor: Making cervical change now. Another vaginal cytotec placed. Hopefully should be able to start IV Pitocin in 4 horus Fetal Wellbeing:  Cat I Pain Control:  IV pain meds per patient's request. May have epidural upon request Anticipated MOD:  SVD  Sonya Green Sonya Ellington, MD 07/09/2017, 10:33 PM

## 2017-07-09 NOTE — Progress Notes (Signed)
Pt very drowsy from IV meds but continues to be very anxious, attempting to get out of bed.  Med student @ bedside talking to pt/family.  Pt starts vomiting again in bed & floor.  Pt pulse elevated & pt instructed she had to sit & try to remain calm.  Pt encouraged to attempt a different position in bed, close eyes to give medication a chance to work & possibly get some sleep.

## 2017-07-09 NOTE — Progress Notes (Signed)
Patient ID: Sonya Green, female   DOB: [birth year], 26 y.o.   MRN: [id number]  Pt calm and resting in bed now; s/p po cyto x 3  BP 142/86, other VSS FHR 110, decreased LTV, some 10x10accels, no decels Ctx irreg 5-8 mins Cx deferred  CBC    Component Value Date/Time   WBC 18.8 (H) 07/09/2017 0554   RBC 4.18 07/09/2017 0554   HGB 13.7 07/09/2017 0554   HGB 13.5 07/05/2017 1609   HGB 13.2 05/09/2017   HCT 38.4 07/09/2017 0554   HCT 39.8 07/05/2017 1609   HCT 40 05/09/2017   PLT 192 07/09/2017 0554   PLT 204 07/05/2017 1609   PLT 236 03/17/2017   MCV 91.9 07/09/2017 0554   MCV 94 07/05/2017 1609   MCH 32.8 07/09/2017 0554   MCHC 35.7 07/09/2017 0554   RDW 13.0 07/09/2017 0554   RDW 13.2 07/05/2017 1609   CMP     Component Value Date/Time   NA 133 (L) 07/09/2017 0554   NA 138 07/05/2017 1609   K 4.3 07/09/2017 0554   CL 102 07/09/2017 0554   CO2 21 (L) 07/09/2017 0554   GLUCOSE 115 (H) 07/09/2017 0554   BUN 13 07/09/2017 0554   BUN 11 07/05/2017 1609   CREATININE 0.90 07/09/2017 0554   CALCIUM 8.3 (L) 07/09/2017 0554   PROT 6.6 07/09/2017 0554   PROT 6.4 07/05/2017 1609   ALBUMIN 3.3 (L) 07/09/2017 0554   ALBUMIN 3.7 07/05/2017 1609   AST 22 07/09/2017 0554   ALT 15 07/09/2017 0554   ALKPHOS 366 (H) 07/09/2017 0554   BILITOT 0.7 07/09/2017 0554   BILITOT 0.3 07/05/2017 1609   GFRNONAA >60 07/09/2017 0554   GFRAA >60 07/09/2017 0554    IUP@36 .5wks Severe pre-e; labs stable IOL process  Will change cytotec dosing to vaginal; attempt cervical foley placement later this morning Continue with IOL process and watching BPs Second BMZ ordered for this evening  Cam HaiSHAW, KIMBERLY CNM 07/09/2017 6:41 AM

## 2017-07-09 NOTE — Progress Notes (Addendum)
LABOR PROGRESS NOTE  Sonya Green is a 26 y.o. G1P0 at 7371w5d  admitted for IOL for severe pre-eclampsia.  Subjective: Patient denies any concerns at this time despite   Objective: BP (!) 150/86   Pulse 90   Temp 98 F (36.7 C) (Oral)   Resp 18   Ht 5\' 7"  (1.702 m)   Wt 165 lb (74.8 kg)   SpO2 98%   BMI 25.84 kg/m  or  Vitals:   07/09/17 1555 07/09/17 1607 07/09/17 1700 07/09/17 1800  BP: (!) 165/94 (!) 150/86    Pulse: (!) 107 90    Resp: 18 18 (!) 22 18  Temp: 98 F (36.7 C)     TempSrc: Oral     SpO2:      Weight:      Height:        SVE: /70%/-2  FHT: baseline rate 125, moderate varibility, +acel,  Early decel Toco: irregular  UOP: ~16690ml/hr  Labs: Lab Results  Component Value Date   WBC 18.8 (H) 07/09/2017   HGB 13.7 07/09/2017   HCT 38.4 07/09/2017   MCV 91.9 07/09/2017   PLT 192 07/09/2017   Magnesium level: 7.0  Patient Active Problem List   Diagnosis Date Noted  . Small AC  07/01/2017  . UTI in pregnancy 07/01/2017  . Severe preeclampsia 06/18/2017  . Supervision of high risk pregnancy, antepartum, third trimester 03/09/2017   Assessment / Plan: 26 y.o. G1P0 at 6671w5d here for IOL for pre-eclampsia  PreE: Bps mild range now. On IV mag. Adequate UOP Labor: Continue vaginal cytotec for cervical ripening. Placed 3rd vaginal cytotec at 17:12. Have been unable to place FB d/t patient's anxiety and poor tolerance of cervical exams Fetal Wellbeing:  Cat I Pain Control:  Per patient's request Anticipated MOD:  SVD  Frederik PearJulie P Degele, MD 07/09/2017, 6:15 PM

## 2017-07-10 ENCOUNTER — Encounter (HOSPITAL_COMMUNITY)
Admission: AD | Disposition: A | Discharge status: Home / Self Care | Payer: Self-pay | Source: Ambulatory Visit | Attending: Obstetrics & Gynecology

## 2017-07-10 ENCOUNTER — Inpatient Hospital Stay (HOSPITAL_COMMUNITY): Payer: Managed Care, Other (non HMO) | Admitting: Anesthesiology

## 2017-07-10 ENCOUNTER — Encounter (HOSPITAL_COMMUNITY): Payer: Self-pay | Admitting: Anesthesiology

## 2017-07-10 DIAGNOSIS — O1414 Severe pre-eclampsia complicating childbirth: Secondary | ICD-10-CM

## 2017-07-10 DIAGNOSIS — Z3A36 36 weeks gestation of pregnancy: Secondary | ICD-10-CM

## 2017-07-10 LAB — CBC
HEMATOCRIT: 37.4 % (ref 36.0–46.0)
HEMOGLOBIN: 13 g/dL (ref 12.0–15.0)
MCH: 32.8 pg (ref 26.0–34.0)
MCHC: 34.8 g/dL (ref 30.0–36.0)
MCV: 94.4 fL (ref 78.0–100.0)
PLATELETS: 229 10*3/uL (ref 150–400)
RBC: 3.96 MIL/uL (ref 3.87–5.11)
RDW: 13.1 % (ref 11.5–15.5)
WBC: 24.4 10*3/uL — AB (ref 4.0–10.5)

## 2017-07-10 SURGERY — Surgical Case
Anesthesia: Epidural

## 2017-07-10 MED ORDER — OXYTOCIN 40 UNITS IN LACTATED RINGERS INFUSION - SIMPLE MED: 1.0000 m[IU]/min | INTRAVENOUS | Status: DC

## 2017-07-10 MED ORDER — LACTATED RINGERS IV SOLN
500.0000 mL | Freq: Once | INTRAVENOUS | Status: AC
  Administered 2017-07-10: 500 mL via INTRAVENOUS

## 2017-07-10 MED ORDER — SIMETHICONE 80 MG PO CHEW
80.0000 mg | CHEWABLE_TABLET | ORAL | Status: DC
  Administered 2017-07-10 – 2017-07-13 (×2): 80 mg via ORAL
  Filled 2017-07-10 (×3): qty 1

## 2017-07-10 MED ORDER — DEXAMETHASONE SODIUM PHOSPHATE 10 MG/ML IJ SOLN
INTRAMUSCULAR | Status: AC
  Filled 2017-07-10: qty 1

## 2017-07-10 MED ORDER — DIPHENHYDRAMINE HCL 50 MG/ML IJ SOLN: 12.5000 mg | INTRAMUSCULAR | Status: DC | PRN

## 2017-07-10 MED ORDER — EPHEDRINE 5 MG/ML INJ: 10.0000 mg | INTRAVENOUS | Status: DC | PRN

## 2017-07-10 MED ORDER — OXYTOCIN 40 UNITS IN LACTATED RINGERS INFUSION - SIMPLE MED
2.5000 [IU]/h | INTRAVENOUS | Status: AC
Start: 2017-07-10 — End: 2017-07-11

## 2017-07-10 MED ORDER — OXYCODONE-ACETAMINOPHEN 5-325 MG PO TABS
1.0000 | ORAL_TABLET | ORAL | Status: DC | PRN
  Administered 2017-07-13: 1 via ORAL
  Filled 2017-07-10: qty 1

## 2017-07-10 MED ORDER — PHENYLEPHRINE 40 MCG/ML (10ML) SYRINGE FOR IV PUSH (FOR BLOOD PRESSURE SUPPORT)
PREFILLED_SYRINGE | INTRAVENOUS | Status: AC
  Filled 2017-07-10: qty 20

## 2017-07-10 MED ORDER — TETANUS-DIPHTH-ACELL PERTUSSIS 5-2.5-18.5 LF-MCG/0.5 IM SUSP: 0.5000 mL | Freq: Once | INTRAMUSCULAR | Status: DC

## 2017-07-10 MED ORDER — LACTATED RINGERS IV SOLN
INTRAVENOUS | Status: DC
  Administered 2017-07-11: 06:00:00 via INTRAVENOUS

## 2017-07-10 MED ORDER — ONDANSETRON HCL 4 MG/2ML IJ SOLN
4.0000 mg | Freq: Three times a day (TID) | INTRAMUSCULAR | Status: DC | PRN
Start: 2017-07-10 — End: 2017-07-13

## 2017-07-10 MED ORDER — FENTANYL 2.5 MCG/ML BUPIVACAINE 1/10 % EPIDURAL INFUSION (WH - ANES)
INTRAMUSCULAR | Status: AC
  Filled 2017-07-10: qty 100

## 2017-07-10 MED ORDER — IBUPROFEN 600 MG PO TABS
600.0000 mg | ORAL_TABLET | Freq: Four times a day (QID) | ORAL | Status: DC
  Administered 2017-07-10 – 2017-07-13 (×9): 600 mg via ORAL
  Filled 2017-07-10 (×10): qty 1

## 2017-07-10 MED ORDER — CEFAZOLIN SODIUM-DEXTROSE 2-4 GM/100ML-% IV SOLN
INTRAVENOUS | Status: AC
  Filled 2017-07-10: qty 100

## 2017-07-10 MED ORDER — HYDROMORPHONE HCL 1 MG/ML IJ SOLN: 0.2500 mg | INTRAMUSCULAR | Status: DC | PRN

## 2017-07-10 MED ORDER — LIDOCAINE HCL (PF) 1 % IJ SOLN
INTRAMUSCULAR | Status: DC | PRN
  Administered 2017-07-10 (×2): 5 mL via EPIDURAL

## 2017-07-10 MED ORDER — OXYCODONE HCL 5 MG PO TABS: 5.0000 mg | ORAL_TABLET | Freq: Once | ORAL | Status: DC | PRN

## 2017-07-10 MED ORDER — PRENATAL MULTIVITAMIN CH
1.0000 | ORAL_TABLET | Freq: Every day | ORAL | Status: DC
  Administered 2017-07-11 – 2017-07-13 (×3): 1 via ORAL
  Filled 2017-07-10 (×3): qty 1

## 2017-07-10 MED ORDER — ONDANSETRON HCL 4 MG/2ML IJ SOLN
INTRAMUSCULAR | Status: DC | PRN
  Administered 2017-07-10: 4 mg via INTRAVENOUS

## 2017-07-10 MED ORDER — SIMETHICONE 80 MG PO CHEW
80.0000 mg | CHEWABLE_TABLET | ORAL | Status: DC | PRN
  Administered 2017-07-11 – 2017-07-12 (×2): 80 mg via ORAL

## 2017-07-10 MED ORDER — MAGNESIUM HYDROXIDE 400 MG/5ML PO SUSP: 30.0000 mL | ORAL | Status: DC | PRN

## 2017-07-10 MED ORDER — ACETAMINOPHEN 325 MG PO TABS
650.0000 mg | ORAL_TABLET | ORAL | Status: DC | PRN
  Administered 2017-07-12: 650 mg via ORAL
  Filled 2017-07-10 (×2): qty 2

## 2017-07-10 MED ORDER — NALBUPHINE HCL 10 MG/ML IJ SOLN: 5.0000 mg | INTRAMUSCULAR | Status: DC | PRN

## 2017-07-10 MED ORDER — DIBUCAINE 1 % RE OINT: 1.0000 "application " | TOPICAL_OINTMENT | RECTAL | Status: DC | PRN

## 2017-07-10 MED ORDER — SODIUM CHLORIDE 0.9% FLUSH: 3.0000 mL | INTRAVENOUS | Status: DC | PRN

## 2017-07-10 MED ORDER — MORPHINE SULFATE (PF) 0.5 MG/ML IJ SOLN
INTRAMUSCULAR | Status: AC
Start: 2017-07-10 — End: 2017-07-10
  Filled 2017-07-10: qty 10

## 2017-07-10 MED ORDER — ZOLPIDEM TARTRATE 5 MG PO TABS
5.0000 mg | ORAL_TABLET | Freq: Every evening | ORAL | Status: DC | PRN
  Administered 2017-07-13: 5 mg via ORAL
  Filled 2017-07-10: qty 1

## 2017-07-10 MED ORDER — SODIUM BICARBONATE 8.4 % IV SOLN
INTRAVENOUS | Status: DC | PRN
  Administered 2017-07-10 (×2): 5 mL via EPIDURAL

## 2017-07-10 MED ORDER — BUPIVACAINE HCL (PF) 0.5 % IJ SOLN
INTRAMUSCULAR | Status: AC
  Filled 2017-07-10: qty 30

## 2017-07-10 MED ORDER — PROMETHAZINE HCL 25 MG/ML IJ SOLN: 6.2500 mg | INTRAMUSCULAR | Status: DC | PRN

## 2017-07-10 MED ORDER — LACTATED RINGERS IV SOLN
INTRAVENOUS | Status: DC | PRN
  Administered 2017-07-10: 40 [IU] via INTRAVENOUS

## 2017-07-10 MED ORDER — ONDANSETRON HCL 4 MG/2ML IJ SOLN
INTRAMUSCULAR | Status: AC
  Filled 2017-07-10: qty 2

## 2017-07-10 MED ORDER — MAGNESIUM SULFATE 40 G IN LACTATED RINGERS - SIMPLE
2.0000 g/h | INTRAVENOUS | Status: AC
  Administered 2017-07-11: 2 g/h via INTRAVENOUS
  Filled 2017-07-10: qty 40
  Filled 2017-07-10: qty 500

## 2017-07-10 MED ORDER — NALBUPHINE HCL 10 MG/ML IJ SOLN: 5.0000 mg | Freq: Once | INTRAMUSCULAR | Status: DC | PRN

## 2017-07-10 MED ORDER — SCOPOLAMINE 1 MG/3DAYS TD PT72
MEDICATED_PATCH | TRANSDERMAL | Status: AC
  Filled 2017-07-10: qty 1

## 2017-07-10 MED ORDER — SENNOSIDES-DOCUSATE SODIUM 8.6-50 MG PO TABS
2.0000 | ORAL_TABLET | ORAL | Status: DC
  Administered 2017-07-10 – 2017-07-12 (×3): 2 via ORAL
  Filled 2017-07-10 (×3): qty 2

## 2017-07-10 MED ORDER — DIPHENHYDRAMINE HCL 25 MG PO CAPS: 25.0000 mg | ORAL_CAPSULE | ORAL | Status: DC | PRN

## 2017-07-10 MED ORDER — FERROUS SULFATE 325 (65 FE) MG PO TABS
325.0000 mg | ORAL_TABLET | Freq: Two times a day (BID) | ORAL | Status: DC
  Administered 2017-07-11 – 2017-07-13 (×5): 325 mg via ORAL
  Filled 2017-07-10 (×5): qty 1

## 2017-07-10 MED ORDER — ENOXAPARIN SODIUM 40 MG/0.4ML ~~LOC~~ SOLN
40.0000 mg | SUBCUTANEOUS | Status: DC
Start: 2017-07-11 — End: 2017-07-13
  Administered 2017-07-11 – 2017-07-12 (×2): 40 mg via SUBCUTANEOUS
  Filled 2017-07-10 (×3): qty 0.4

## 2017-07-10 MED ORDER — MEASLES, MUMPS & RUBELLA VAC ~~LOC~~ INJ: 0.5000 mL | INJECTION | Freq: Once | SUBCUTANEOUS | Status: DC

## 2017-07-10 MED ORDER — NALOXONE HCL 0.4 MG/ML IJ SOLN: 0.4000 mg | INTRAMUSCULAR | Status: DC | PRN

## 2017-07-10 MED ORDER — LACTATED RINGERS IV SOLN
INTRAVENOUS | Status: DC | PRN
  Administered 2017-07-10: 13:00:00 via INTRAVENOUS

## 2017-07-10 MED ORDER — SCOPOLAMINE 1 MG/3DAYS TD PT72
MEDICATED_PATCH | TRANSDERMAL | Status: DC | PRN
  Administered 2017-07-10: 1 via TRANSDERMAL

## 2017-07-10 MED ORDER — MEPERIDINE HCL 25 MG/ML IJ SOLN: 6.2500 mg | INTRAMUSCULAR | Status: DC | PRN

## 2017-07-10 MED ORDER — CEFAZOLIN SODIUM-DEXTROSE 2-4 GM/100ML-% IV SOLN
2.0000 g | Freq: Once | INTRAVENOUS | Status: AC
  Administered 2017-07-10: 2 g via INTRAVENOUS

## 2017-07-10 MED ORDER — OXYCODONE-ACETAMINOPHEN 5-325 MG PO TABS
2.0000 | ORAL_TABLET | ORAL | Status: DC | PRN
Start: 2017-07-10 — End: 2017-07-13
  Administered 2017-07-11 – 2017-07-13 (×2): 2 via ORAL
  Filled 2017-07-10 (×2): qty 2

## 2017-07-10 MED ORDER — COCONUT OIL OIL: 1.0000 "application " | TOPICAL_OIL | Status: DC | PRN

## 2017-07-10 MED ORDER — OXYTOCIN 40 UNITS IN LACTATED RINGERS INFUSION - SIMPLE MED
1.0000 m[IU]/min | INTRAVENOUS | Status: DC
  Administered 2017-07-10: 2 m[IU]/min via INTRAVENOUS
  Filled 2017-07-10: qty 1000

## 2017-07-10 MED ORDER — KETOROLAC TROMETHAMINE 30 MG/ML IJ SOLN
30.0000 mg | Freq: Once | INTRAMUSCULAR | Status: DC | PRN
  Administered 2017-07-10: 30 mg via INTRAVENOUS

## 2017-07-10 MED ORDER — FENTANYL 2.5 MCG/ML BUPIVACAINE 1/10 % EPIDURAL INFUSION (WH - ANES)
14.0000 mL/h | INTRAMUSCULAR | Status: DC | PRN
  Administered 2017-07-10: 14 mL/h via EPIDURAL

## 2017-07-10 MED ORDER — KETOROLAC TROMETHAMINE 30 MG/ML IJ SOLN
INTRAMUSCULAR | Status: AC
  Filled 2017-07-10: qty 1

## 2017-07-10 MED ORDER — PHENYLEPHRINE 40 MCG/ML (10ML) SYRINGE FOR IV PUSH (FOR BLOOD PRESSURE SUPPORT): 80.0000 ug | PREFILLED_SYRINGE | INTRAVENOUS | Status: DC | PRN

## 2017-07-10 MED ORDER — MORPHINE SULFATE (PF) 0.5 MG/ML IJ SOLN
INTRAMUSCULAR | Status: DC | PRN
  Administered 2017-07-10: 4 mg via EPIDURAL

## 2017-07-10 MED ORDER — MENTHOL 3 MG MT LOZG: 1.0000 | LOZENGE | OROMUCOSAL | Status: DC | PRN

## 2017-07-10 MED ORDER — NALOXONE HCL 2 MG/2ML IJ SOSY: 1.0000 ug/kg/h | PREFILLED_SYRINGE | INTRAMUSCULAR | Status: DC | PRN

## 2017-07-10 MED ORDER — DIPHENHYDRAMINE HCL 25 MG PO CAPS: 25.0000 mg | ORAL_CAPSULE | Freq: Four times a day (QID) | ORAL | Status: DC | PRN

## 2017-07-10 MED ORDER — DEXAMETHASONE SODIUM PHOSPHATE 10 MG/ML IJ SOLN
INTRAMUSCULAR | Status: DC | PRN
  Administered 2017-07-10: 10 mg via INTRAVENOUS

## 2017-07-10 MED ORDER — SODIUM CHLORIDE 0.9 % IR SOLN
Status: DC | PRN
  Administered 2017-07-10: 1000 mL

## 2017-07-10 MED ORDER — WITCH HAZEL-GLYCERIN EX PADS: 1.0000 "application " | MEDICATED_PAD | CUTANEOUS | Status: DC | PRN

## 2017-07-10 MED ORDER — OXYCODONE HCL 5 MG/5ML PO SOLN: 5.0000 mg | Freq: Once | ORAL | Status: DC | PRN

## 2017-07-10 MED ORDER — SCOPOLAMINE 1 MG/3DAYS TD PT72: 1.0000 | MEDICATED_PATCH | Freq: Once | TRANSDERMAL | Status: DC

## 2017-07-10 MED ORDER — OXYTOCIN 10 UNIT/ML IJ SOLN
INTRAMUSCULAR | Status: AC
  Filled 2017-07-10: qty 4

## 2017-07-10 SURGICAL SUPPLY — 31 items
CHLORAPREP W/TINT 26ML (MISCELLANEOUS) ×3 IMPLANT
CLAMP CORD UMBIL (MISCELLANEOUS) IMPLANT
CLOTH BEACON ORANGE TIMEOUT ST (SAFETY) ×3 IMPLANT
DRSG OPSITE POSTOP 4X10 (GAUZE/BANDAGES/DRESSINGS) ×3 IMPLANT
ELECT REM PT RETURN 9FT ADLT (ELECTROSURGICAL) ×3
ELECTRODE REM PT RTRN 9FT ADLT (ELECTROSURGICAL) ×1 IMPLANT
EXTRACTOR VACUUM M CUP 4 TUBE (SUCTIONS) IMPLANT
EXTRACTOR VACUUM M CUP 4' TUBE (SUCTIONS)
GAUZE SPONGE 4X4 12PLY STRL LF (GAUZE/BANDAGES/DRESSINGS) ×6 IMPLANT
GLOVE BIOGEL PI IND STRL 7.0 (GLOVE) ×3 IMPLANT
GLOVE BIOGEL PI INDICATOR 7.0 (GLOVE) ×6
GLOVE ECLIPSE 7.0 STRL STRAW (GLOVE) ×3 IMPLANT
GOWN STRL REUS W/TWL LRG LVL3 (GOWN DISPOSABLE) ×6 IMPLANT
HEMOSTAT SURGICEL 2X3 (HEMOSTASIS) ×3 IMPLANT
KIT ABG SYR 3ML LUER SLIP (SYRINGE) IMPLANT
NEEDLE HYPO 22GX1.5 SAFETY (NEEDLE) ×3 IMPLANT
NEEDLE HYPO 25X5/8 SAFETYGLIDE (NEEDLE) ×3 IMPLANT
NS IRRIG 1000ML POUR BTL (IV SOLUTION) ×3 IMPLANT
PACK C SECTION WH (CUSTOM PROCEDURE TRAY) ×3 IMPLANT
PAD ABD 7.5X8 STRL (GAUZE/BANDAGES/DRESSINGS) ×3 IMPLANT
PAD OB MATERNITY 4.3X12.25 (PERSONAL CARE ITEMS) ×3 IMPLANT
PENCIL SMOKE EVAC W/HOLSTER (ELECTROSURGICAL) ×3 IMPLANT
RTRCTR C-SECT PINK 25CM LRG (MISCELLANEOUS) IMPLANT
SUT PDS AB 0 CTX 36 PDP370T (SUTURE) IMPLANT
SUT PLAIN 2 0 XLH (SUTURE) IMPLANT
SUT VIC AB 0 CTX 36 (SUTURE) ×6
SUT VIC AB 0 CTX36XBRD ANBCTRL (SUTURE) ×3 IMPLANT
SUT VIC AB 4-0 KS 27 (SUTURE) ×3 IMPLANT
SYR CONTROL 10ML LL (SYRINGE) ×3 IMPLANT
TOWEL OR 17X24 6PK STRL BLUE (TOWEL DISPOSABLE) ×3 IMPLANT
TRAY FOLEY BAG SILVER LF 14FR (SET/KITS/TRAYS/PACK) ×3 IMPLANT

## 2017-07-10 NOTE — Progress Notes (Signed)
Faculty Practice OB/GYN Attending Note   Subjective:  Patient is comfortable with epidural    Objective:  Blood pressure 131/69, pulse 86, temperature 98.7 F (37.1 C), temperature source Oral, resp. rate 18, height 5\' 7"  (1.702 m), weight 165 lb (74.8 kg), SpO2 98 %. FHT  Baseline 120bpm, moderate variability, no accelerations, no decelerations Toco: q 7-8 Gen: NAD HENT: Normocephalic, atraumatic Lungs: Normal respiratory effort Heart: Regular rate noted Abdomen: NT gravid fundus, soft Cervix: Dilation: 6.5 Effacement (%): 100 Cervical Position: Posterior Station: -2 Presentation: Vertex Exam by:: dr Sherrel Ploch Ext: 2+ DTRs, no edema, no cyanosis, negative Homan's sign  Assessment & Plan:  26 y.o. G1P0 at 7555w6d admitted for IOL for severe preeclampsia, had late decelerations a couple of hours ago followed by FHR tracing with minimal variability, now with increased variability and more reassuring. Discussed options of restarting pitocin vs cesarean section with patient, R/B/I/A discussed in detail. Patient wants re-trial of pitocin, knows that if FHR becomes non-reassuring again, will proceed with cesarean delivery.  Will restart pitocin slowly soon. BP stable, continue magnesium sulfate.  Continue close observation.  Jaynie CollinsUGONNA  Brecken Walth, MD, FACOG Attending Obstetrician & Gynecologist Faculty Practice, [facility] - 

## 2017-07-10 NOTE — Progress Notes (Signed)
Patient had another FHR deceleration on pitocin of 1; this was turned off. FHR currently with minimal variability, no accelerations and decels. Contractions every 8-9 minutes. Given fetal intolerance of labor, cesarean delivery recommended.  The risks of cesarean section discussed with the patient included but were not limited to: bleeding which may require transfusion or reoperation; infection which may require antibiotics; injury to bowel, bladder, ureters or other surrounding organs; injury to the fetus; need for additional procedures including hysterectomy in the event of a life-threatening hemorrhage; placental abnormalities wth subsequent pregnancies, incisional problems, thromboembolic phenomenon and other postoperative/anesthesia complications. The patient concurred with the proposed plan, giving informed written consent for the procedure.   Anesthesia and OR aware. Preoperative prophylactic antibiotics (allergic to Azithromycin; Ancef 2 g IV ordered) and SCDs ordered on call to the OR.  To OR when ready.  Sonya Green  Jonothan Heberle, MD, FACOG Attending Obstetrician & Gynecologist, [facility], [facility]

## 2017-07-10 NOTE — Anesthesia Procedure Notes (Signed)
Epidural Patient location during procedure: OB Start time: 07/10/2017 6:10 AM End time: 07/10/2017 6:15 AM  Staffing Anesthesiologist: Benzion Mesta  Preanesthetic Checklist Completed: patient identified, site marked, surgical consent, pre-op evaluation, timeout performed, IV checked, risks and benefits discussed and monitors and equipment checked  Epidural Patient position: sitting Prep: site prepped and draped and DuraPrep Patient monitoring: continuous pulse ox and blood pressure Approach: midline Injection technique: LOR air  Needle:  Needle type: Tuohy  Needle gauge: 17 G Needle length: 9 cm and 9 Needle insertion depth: 5 cm cm Catheter type: closed end flexible Catheter size: 19 Gauge Catheter at skin depth: 10 cm Test dose: negative  Assessment Events: blood not aspirated, injection not painful, no injection resistance, negative IV test and no paresthesia

## 2017-07-10 NOTE — Progress Notes (Signed)
Labor Progress Note  Sonya Green is a 26 y.o. G1P0 at 2539w6d  admitted for IOL for severe pre-eclampsia.  S: Patient seen & examined for progress of labor. Pt is resting/sleeping comfortably in bed and expresses no concerns at this time.   O: BP (!) 170/99   Pulse 94   Temp 98.6 F (37 C) (Oral)   Resp 18   Ht 5\' 7"  (1.702 m)   Wt 74.8 kg (165 lb)   SpO2 98%   BMI 25.84 kg/m   FHT: 120bpm, var < 5 BPM, +accels, early decels TOCO: irregular contractions, patient looks comfortable during contractions  CVE: Dilation: 3 Effacement (%): 60, 70 Cervical Position: Posterior Station: -3 Presentation: Vertex Exam by:: dr degele  A&P: 26 y.o. G1P0 739w6d here for  IOL for severe pre-eclampsia.  Labor:cytotec x 7, will recheck cervix and see if FB can be placed Fetal Wellbeing:Cat I Pain Control: IV pain meds per patient's request. May have epidural upon request Anticipated ZOX:WRUEOD:NSVD  Preeclampsia: currently on magnesium, continue to closely monitor vitals and urine output   Continue induction   Obie DredgeNelly TWeledji Medical Student 07/10/2017 2:28 AM

## 2017-07-10 NOTE — Consult Note (Signed)
[facility]  Delivery Note:  C-section       07/10/2017  1:26 PM  I was called to the operating room at the request of the patient's obstetrician (Dr. Anyanwu) for a primary c-section.  PRENATAL HX:  This is a 25 y/o G1P0 at 36 and 6/[redacted] weeks gestation who was admitted for IOL due to pre eclampsia .  Her pregnancy has been complicated by anxiety and hypertension.  C-section for fetal intolerance to labor.  She received BMZ x2 as well as magnesium for blood pressure.   DELIVERY:  Cord clamping delayed for 1 minute.  Infant was vigorous at delivery, initially requiring no resuscitation other than standard warming, drying and stimulation.  Infant had shallow breathing, mild hypotonia, and central cyanosis so a pulse oximeter was applied.  This may be related to magnesium exposure. Blow by O2 administered from 4 minutes of life to 7 minutes of life and O2 saturations subsequently rose from low 70s to low 90s.  They remained in the low 90s after blow by removed.  He remained stable in RA with improved tone.  APGARs 7, 8 and 9.  After 10 minutes, baby left with nurse to assist parents with skin-to-skin care.   _____________________ Electronically Signed By: Olivene Cookston, MD Neonatologist  

## 2017-07-10 NOTE — Progress Notes (Signed)
FHT with late decels. Usual interventions done. Pitocin stopped. SVE 7cm/100/-2. AROM with scant clear fluid.  Raynelle FanningJulie P. Marietta Sikkema, MD OB Fellow

## 2017-07-10 NOTE — Progress Notes (Signed)
Dr Renold DonGermeroth consulted if CBC needed for removal of epidural. Labs reviewed and ok to remove epidural when ready and permission given for sign out after report on patient given.

## 2017-07-10 NOTE — Anesthesia Preprocedure Evaluation (Addendum)
Anesthesia Evaluation  Patient identified by MRN, date of birth, ID band Patient awake    Reviewed: Allergy & Precautions, H&P , NPO status , Patient's Chart, lab work & pertinent test results, reviewed documented beta blocker date and time   Airway Mallampati: II  TM Distance: >3 FB Neck ROM: full    Dental no notable dental hx.    Pulmonary neg pulmonary ROS,    Pulmonary exam normal breath sounds clear to auscultation       Cardiovascular hypertension, On Medications negative cardio ROS Normal cardiovascular exam Rhythm:regular Rate:Normal     Neuro/Psych negative neurological ROS  negative psych ROS   GI/Hepatic negative GI ROS, Neg liver ROS,   Endo/Other  negative endocrine ROS  Renal/GU negative Renal ROS  negative genitourinary   Musculoskeletal   Abdominal   Peds  Hematology negative hematology ROS (+)   Anesthesia Other Findings   Reproductive/Obstetrics (+) Pregnancy                             Anesthesia Physical Anesthesia Plan  ASA: III  Anesthesia Plan: Epidural   Post-op Pain Management:    Induction:   PONV Risk Score and Plan:   Airway Management Planned:   Additional Equipment:   Intra-op Plan:   Post-operative Plan:   Informed Consent: I have reviewed the patients History and Physical, chart, labs and discussed the procedure including the risks, benefits and alternatives for the proposed anesthesia with the patient or authorized representative who has indicated his/her understanding and acceptance.     Plan Discussed with: CRNA  Anesthesia Plan Comments:        Anesthesia Quick Evaluation

## 2017-07-10 NOTE — Op Note (Signed)
Sonya Green PROCEDURE DATE: 07/10/2017  PREOPERATIVE DIAGNOSES: Intrauterine pregnancy at 1966w6d weeks gestation; failed induction, non-reassuring fetal status and severe preeclampsia  POSTOPERATIVE DIAGNOSES: The same  PROCEDURE: Primary Low Transverse Cesarean Section  SURGEON:  Dr. Jaynie CollinsUgonna Elmina Hendel   ANESTHESIOLOGY TEAM: Anesthesiologist: Lewie LoronGermeroth, John, MD CRNA: Rica Recordsickelton, Angela, CRNA  INDICATIONS: Sonya Green is a 26 y.o. G1P0 at 1666w6d here for cesarean section secondary to the indications listed under preoperative diagnoses; please see preoperative note for further details.  The risks of cesarean section were discussed with the patient including but were not limited to: bleeding which may require transfusion or reoperation; infection which may require antibiotics; injury to bowel, bladder, ureters or other surrounding organs; injury to the fetus; need for additional procedures including hysterectomy in the event of a life-threatening hemorrhage; placental abnormalities wth subsequent pregnancies, incisional problems, thromboembolic phenomenon and other postoperative/anesthesia complications.   The patient concurred with the proposed plan, giving informed written consent for the procedure.    FINDINGS:  Viable female infant in cephalic presentation.  Apgars 7/8/9.  Loose nuchal cord x 2, and body cord noted. Clear amniotic fluid.  Intact placenta, three vessel cord.  Normal uterus, fallopian tubes and ovaries bilaterally.  ANESTHESIA: Epidural  INTRAVENOUS FLUIDS: 1000 ml   ESTIMATED BLOOD LOSS: 600 ml URINE OUTPUT:  100 ml SPECIMENS: Placenta sent to pathology COMPLICATIONS: None immediate  PROCEDURE IN DETAIL:  The patient preoperatively received intravenous antibiotics and had sequential compression devices applied to her lower extremities.  She was then taken to the operating room where the epidural anesthesia was dosed up to surgical level and was found to be adequate. She was  then placed in a dorsal supine position with a leftward tilt, and prepped and draped in a sterile manner.  A foley catheter was placed into her bladder and attached to constant gravity.  After an adequate timeout was performed, a Pfannenstiel skin incision was made with scalpeland carried through to the underlying layer of fascia. The fascia was incised in the midline, and this incision was extended bilaterally using the Mayo scissors.  Kocher clamps were applied to the superior aspect of the fascial incision and the underlying rectus muscles were dissected off bluntly.  A similar process was carried out on the inferior aspect of the fascial incision. The rectus muscles were separated in the midline bluntly and the peritoneum was entered bluntly. Attention was turned to the lower uterine segment where a low transverse hysterotomy was made with a scalpel and extended bilaterally bluntly.  The infant was successfully delivered, the cord was clamped and cut after one minute, and the infant was handed over to the awaiting neonatology team. Uterine massage was then administered, and the placenta delivered intact with a three-vessel cord. The uterus was then cleared of clots and debris.  The hysterotomy was closed with 0 Vicryl in a running locked fashion, and an imbricating layer was also placed with 0 Vicryl.  Figure-of-eight 0 Vicryl serosal stitches were placed to help with hemostasis.  The pelvis was cleared of all clot and debris. Hemostasis was confirmed on all surfaces.  The peritoneum and the rectus muscles were reapproximated using 0 Vicryl interrupted stitches. The fascia was then closed using 0 Vicryl in a running fashion.  The subcutaneous layer was irrigated, and 30 ml of 0.5% Marcaine was injected subcutaneously around the incision.  The skin was closed with a 4-0 Vicryl subcuticular stitch. The patient tolerated the procedure well. Sponge, lap, instrument and needle counts were correct  x 3.  She was taken  to the recovery room in stable condition.    Jaynie Collins, MD, FACOG Attending Obstetrician & Gynecologist Faculty Practice, [facility]

## 2017-07-10 NOTE — Progress Notes (Signed)
Pt comfortable, resting, dozing off and on. Mother at side, very comforting and caring. Patient woke up and asked if she could have medicine for depression after delivery if needed. Will pass on to OB. Mother states pt will have support after delivery by herself and an aunt, especially the first week at home and she will not have to worry about housework or anything.

## 2017-07-10 NOTE — Transfer of Care (Signed)
Immediate Anesthesia Transfer of Care Note  Patient: Sonya Green  Procedure(s) Performed: Procedure(s): CESAREAN SECTION (N/A)  Patient Location: PACU  Anesthesia Type:Epidural  Level of Consciousness: awake  Airway & Oxygen Therapy: Patient Spontanous Breathing  Post-op Assessment: Report given to RN  Post vital signs: stable  Last Vitals:  Vitals:   07/10/17 1216 07/10/17 1231  BP:  134/78  Pulse:  82  Resp: 18 18  Temp:    SpO2:      Last Pain:  Vitals:   07/10/17 1216  TempSrc:   PainSc: 0-No pain      Patients Stated Pain Goal: 2 (07/09/17 1600)  Complications: No apparent anesthesia complications

## 2017-07-10 NOTE — Progress Notes (Signed)
LABOR PROGRESS NOTE  Tresa Endongelita Bookbinder is a 26 y.o. G1P0 at 5463w5d  admitted for IOL for severe pre-eclampsia.  Subjective: Patient doing well at this time. Denies any concerns.   Objective: BP 136/76   Pulse 87   Temp 98.4 F (36.9 C) (Oral)   Resp 16   Ht 5\' 7"  (1.702 m)   Wt 165 lb (74.8 kg)   SpO2 98%   BMI 25.84 kg/m  or  Vitals:   07/10/17 0201 07/10/17 0212 07/10/17 0301 07/10/17 0401  BP: (!) 170/99 137/81 135/73 136/76  Pulse: 94 86 83 87  Resp:   16   Temp:      TempSrc:      SpO2:      Weight:      Height:        SVE: 4-5cm/80%/-2  FHT: baseline rate 120, min varibility, +acel,  early decel Toco: ctx q6- 8 min  Assessment / Plan: 26 y.o. G1P0 at 5663w5d here for IOL for pre-eclampsia with severe features  PreE: Bps mild range now. On IV mag. No signs/sx of toxicity. Adequate UOP Labor: Start IV Pit Fetal Wellbeing:  Cat II Pain Control:  IV pain meds per patient's request. May have epidural upon request Anticipated MOD:  SVD  Frederik PearJulie P Dorla Guizar, MD 07/10/2017, 4:26 AM

## 2017-07-10 NOTE — Progress Notes (Signed)
fhr variabilty minimal, rare late decel, cnm called, iv bolus 200cc, patient asked to turn to right side, mother helping. Pt states she cannot breathe on this side. Pt wearing oxygen mask. Pt willing to change position. Oxygen sat 100%. Pt advised her oygen level appears adequate. Pt drifted off to sleep.

## 2017-07-11 LAB — COMPREHENSIVE METABOLIC PANEL
ALK PHOS: 243 U/L — AB (ref 38–126)
ALT: 13 U/L — ABNORMAL LOW (ref 14–54)
ANION GAP: 8 (ref 5–15)
AST: 27 U/L (ref 15–41)
Albumin: 2.7 g/dL — ABNORMAL LOW (ref 3.5–5.0)
BILIRUBIN TOTAL: 0.4 mg/dL (ref 0.3–1.2)
BUN: 21 mg/dL — ABNORMAL HIGH (ref 6–20)
CALCIUM: 6.5 mg/dL — AB (ref 8.9–10.3)
CO2: 25 mmol/L (ref 22–32)
Chloride: 100 mmol/L — ABNORMAL LOW (ref 101–111)
Creatinine, Ser: 0.97 mg/dL (ref 0.44–1.00)
GFR calc non Af Amer: >60 mL/min (ref >60)
Glucose, Bld: 102 mg/dL — ABNORMAL HIGH (ref 65–99)
Potassium: 4.3 mmol/L (ref 3.5–5.1)
SODIUM: 133 mmol/L — AB (ref 135–145)
TOTAL PROTEIN: 5.6 g/dL — AB (ref 6.5–8.1)

## 2017-07-11 LAB — CBC
HEMATOCRIT: 32.3 % — AB (ref 36.0–46.0)
HEMOGLOBIN: 11.6 g/dL — AB (ref 12.0–15.0)
MCH: 33.6 pg (ref 26.0–34.0)
MCHC: 35.9 g/dL (ref 30.0–36.0)
MCV: 93.6 fL (ref 78.0–100.0)
Platelets: 195 10*3/uL (ref 150–400)
RBC: 3.45 MIL/uL — ABNORMAL LOW (ref 3.87–5.11)
RDW: 13 % (ref 11.5–15.5)
WBC: 23.4 10*3/uL — ABNORMAL HIGH (ref 4.0–10.5)

## 2017-07-11 LAB — URINE CULTURE, OB REFLEX

## 2017-07-11 LAB — CULTURE, OB URINE

## 2017-07-11 NOTE — Anesthesia Postprocedure Evaluation (Signed)
Anesthesia Post Note  Patient: Sonya Green  Procedure(s) Performed: Procedure(s) (LRB): CESAREAN SECTION (N/A)     Patient location during evaluation: Mother Baby Anesthesia Type: Epidural Level of consciousness: awake and alert Pain management: pain level controlled Vital Signs Assessment: post-procedure vital signs reviewed and stable Respiratory status: spontaneous breathing, nonlabored ventilation and respiratory function stable Cardiovascular status: stable Postop Assessment: no headache, no backache and epidural receding Anesthetic complications: no    Last Vitals:  Vitals:   07/11/17 1200 07/11/17 1600  BP: (!) 150/92 128/72  Pulse: 66 77  Resp: 18 18  Temp: 36.7 C 36.9 C  SpO2: 96%     Last Pain:  Vitals:   07/11/17 1600  TempSrc: Oral  PainSc: 2    Pain Goal: Patients Stated Pain Goal: 3 (07/11/17 1600)               Yeshua Stryker

## 2017-07-11 NOTE — Progress Notes (Signed)
Subjective: Postpartum Day 1: Cesarean Delivery on Magnesium for pre eclampsia Patient reports incisional pain and tolerating PO.    Objective: Vital signs in last 24 hours: Temp:  [98.3 F (36.8 C)-99 F (37.2 C)] 98.5 F (36.9 C) (09/03 0800) Pulse Rate:  [65-95] 74 (09/03 0800) Resp:  [14-21] 18 (09/03 0800) BP: (119-153)/(70-106) 119/70 (09/03 0800) SpO2:  [94 %-98 %] 96 % (09/03 0800)  Physical Exam:  General: alert, cooperative and no distress Lochia: appropriate Uterine Fundus: firm Incision: healing well, no significant drainage, no dehiscence, no significant erythema DVT Evaluation: No evidence of DVT seen on physical exam.   Recent Labs  07/10/17 0545 07/11/17 0522  HGB 13.0 11.6*  HCT 37.4 32.3*    Assessment/Plan: Status post Cesarean section. Doing well postoperatively.  Continue current care Stop magnesium later today.  EURE,LUTHER H 07/11/2017, 10:09 AM

## 2017-07-11 NOTE — Addendum Note (Signed)
Addendum  created 07/11/17 2006 by Junious SilkGilbert, Elodia Haviland, CRNA   Sign clinical note

## 2017-07-11 NOTE — Lactation Note (Signed)
This note was copied from a baby's chart. Lactation Consultation Note  Patient Name: Sonya Tresa Endongelita Loos UVOZD'GToday's Date: 07/11/2017 Reason for consult: Initial assessment;Late-preterm 34-36.6wks;1st time breastfeeding;Primapara;Infant < 6lbs   Initial consult at 26 hrs old; LPTI GA 36.6; BW 5 lbs, 3.6 oz.  Mom is P1; Hx severe preeclampsia.   Infant born via C-section with apgars 7 & 8.  Yesterday glucose was <20 but next 2 results were 65 & 62.  Last temp normal. Infant has breastfed x3 (20-30 min) + attempt x2 (0-5 min) + formula supplementation via bottle x7 (10-15 ml); voids-4; stools-1; LS-5 by RN. [facility]   LC gave LPTI Green Sheet and Guidelines; reviewed guidelines and feeding amounts, and limiting total time of feedings to 30 minutes.     Mom has only breastfed x1 today and pumped once.   Encouraged attempting to breastfeed.  Taught mom how to hand express with return demonstration with observation of colostrum glistening tip of nipples. LC assisted with latching in football hold on left side but infant too sleepy to latch.  Taught mom how to sandwich breast with support and how to latch infant but infant too sleepy. Put infant STS upright on mom's chest and told mom to watch feeding cues. Reviewed size of infant's stomach, cluster feeding, and feeding cues. Lactation brochure given and informed of hospital support group and OP support. Encouraged to call RN with next latch for assistance.     Maternal Data Has patient been taught Hand Expression?: Yes (small drop of colostrum observed) Does the patient have breastfeeding experience prior to this delivery?: No  Feeding Feeding Type: Breast Fed Length of feed: 0 min  LATCH Score Latch: Too sleepy or reluctant, no latch achieved, no sucking elicited.     Type of Nipple: Everted at rest and after stimulation (semi-flat; short shafted; will evert with stimulation)  Comfort  (Breast/Nipple): Soft / non-tender  Hold (Positioning): Assistance needed to correctly position infant at breast and maintain latch.     Interventions Interventions: Breast feeding basics reviewed;Assisted with latch;Skin to skin;Support pillows;Hand express  Lactation Tools Discussed/Used     Consult Status Consult Status: Follow-up Date: 07/12/17 Follow-up type: In-patient    Lendon KaVann, Monty Mccarrell Walker 07/11/2017, 3:44 PM

## 2017-07-11 NOTE — Progress Notes (Signed)
Pt reporting N&V and "feeling out of it". Contact made with Dr. Despina HiddenEure in regards to keeping the Magnesium infusing. Dr. Despina HiddenEure states to continue plan of care and no new orders given. Patient made aware. Carmelina DaneERRI L Wilberth Damon, RN

## 2017-07-11 NOTE — Anesthesia Postprocedure Evaluation (Signed)
Anesthesia Post Note  Patient: Sonya Green  Procedure(s) Performed: Procedure(s) (LRB): CESAREAN SECTION (N/A)     Patient location during evaluation: PACU Anesthesia Type: Epidural Level of consciousness: awake and alert Pain management: pain level controlled Vital Signs Assessment: post-procedure vital signs reviewed and stable Respiratory status: spontaneous breathing Cardiovascular status: stable Postop Assessment: epidural receding Anesthetic complications: no    Last Vitals:  Vitals:   07/11/17 0001 07/11/17 0100  BP:    Pulse:    Resp: 17 16  Temp:    SpO2: 96% 94%    Last Pain:  Vitals:   07/11/17 0000  TempSrc:   PainSc: 5    Pain Goal: Patients Stated Pain Goal: 3 (07/11/17 0000)               Lewie LoronJohn Marcellis Frampton

## 2017-07-12 ENCOUNTER — Inpatient Hospital Stay (HOSPITAL_COMMUNITY): Admission: RE | Admit: 2017-07-12 | Payer: Managed Care, Other (non HMO) | Source: Ambulatory Visit

## 2017-07-12 ENCOUNTER — Encounter (HOSPITAL_COMMUNITY): Payer: Self-pay | Admitting: *Deleted

## 2017-07-12 NOTE — Progress Notes (Signed)
Subjective: Postpartum Day 2: Cesarean Delivery Patient reports incisional pain, tolerating PO and no problems voiding.    Objective: Vital signs in last 24 hours: Temp:  [97.8 F (36.6 C)-98.5 F (36.9 C)] 98.4 F (36.9 C) (09/04 0810) Pulse Rate:  [51-77] 57 (09/04 0810) Resp:  [17-18] 18 (09/04 0810) BP: (128-150)/(72-92) 150/91 (09/04 0810) SpO2:  [96 %-99 %] 96 % (09/04 0810)  Physical Exam:  General: alert, cooperative and no distress Lochia: appropriate Uterine Fundus: firm Incision: dry DVT Evaluation: No evidence of DVT seen on physical exam.   Recent Labs  07/10/17 0545 07/11/17 0522  HGB 13.0 11.6*  HCT 37.4 32.3*    Assessment/Plan: Status post Cesarean section. Doing well postoperatively.  BP borderline, if climbs will add norvasc 5- 10 mg  Tamrah Victorino H 07/12/2017, 10:01 AM

## 2017-07-12 NOTE — Plan of Care (Signed)
Problem: Nutritional: Goal: Mothers verbalization of comfort with breastfeeding process will improve Outcome: Not Applicable Date Met: 27/67/01 Pt has decided to bottle feed
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
## 2017-07-13 DIAGNOSIS — Z98891 History of uterine scar from previous surgery: Secondary | ICD-10-CM

## 2017-07-13 MED ORDER — SENNOSIDES-DOCUSATE SODIUM 8.6-50 MG PO TABS: 2.0000 | ORAL_TABLET | ORAL | 1 refills | Status: DC

## 2017-07-13 MED ORDER — FERROUS SULFATE 325 (65 FE) MG PO TABS: 325.0000 mg | ORAL_TABLET | Freq: Two times a day (BID) | ORAL | 3 refills | Status: DC

## 2017-07-13 MED ORDER — OXYCODONE-ACETAMINOPHEN 5-325 MG PO TABS: 1.0000 | ORAL_TABLET | ORAL | 0 refills | Status: DC | PRN

## 2017-07-13 MED ORDER — AMLODIPINE BESYLATE 5 MG PO TABS
5.0000 mg | ORAL_TABLET | Freq: Every day | ORAL | Status: DC
Start: 2017-07-13 — End: 2017-07-13
  Administered 2017-07-13: 5 mg via ORAL
  Filled 2017-07-13: qty 1

## 2017-07-13 MED ORDER — AMLODIPINE BESYLATE 5 MG PO TABS: 5.0000 mg | ORAL_TABLET | Freq: Every day | ORAL | 0 refills | Status: DC

## 2017-07-13 MED ORDER — IBUPROFEN 600 MG PO TABS
600.0000 mg | ORAL_TABLET | Freq: Four times a day (QID) | ORAL | 0 refills | Status: DC
Start: 2017-07-13 — End: 2018-04-14

## 2017-07-13 NOTE — Discharge Summary (Signed)
OB Discharge Summary     Patient Name: Sonya Green DOB: [birth year] MRN: [id number]  Date of admission: 07/08/2017 Delivering MD: Jaynie Collins A   Date of discharge: 07/13/2017  Admitting diagnosis: INDUCTION Intrauterine pregnancy: [redacted]w[redacted]d     Secondary diagnosis:  Principal Problem:   Severe preeclampsia Active Problems:   Status post cesarean section  Additional problems: none     Discharge diagnosis: Preterm Pregnancy Delivered and Preeclampsia (severe)                                                                                                Post partum procedures:none  Augmentation: AROM and Pitocin  Complications: None  Hospital course:  Induction of Labor With Cesarean Section  26 y.o. yo G1P0101 at [redacted]w[redacted]d was admitted to the hospital 07/08/2017 for induction of labor. Patient had a labor course significant for repetitive decelerations. The patient went for cesarean section due to Non-Reassuring FHR, and delivered a Viable infant,@BABYSUPPRESS (DBLINK,ept,110,,1,,) Membrane Rupture Time/Date: )7:05 AM ,07/10/2017   @Details  of operation can be found in separate operative Note.  Patient had an uncomplicated postpartum course. She is ambulating, tolerating a regular diet, passing flatus, and urinating well.  Patient is discharged home in stable condition on 07/13/17.                                    Physical exam  Vitals:   07/12/17 1500 07/12/17 1956 07/13/17 0326 07/13/17 0433  BP: (!) 152/94 (!) 148/87 (!) 153/92 (!) 143/93  Pulse: 65 (!) 56 81 73  Resp: 18 17 17 17   Temp: 98.3 F (36.8 C) 97.8 F (36.6 C) 99.8 F (37.7 C)   TempSrc: Oral Oral Oral   SpO2: 98% 100% 97% 99%  Weight:      Height:       General: alert, cooperative and no distress Lochia: appropriate Uterine Fundus: firm Incision: Healing well with no significant drainage, No significant erythema DVT Evaluation: No evidence of DVT seen on physical exam. Negative Homan's sign. Labs: Lab  Results  Component Value Date   WBC 23.4 (H) 07/11/2017   HGB 11.6 (L) 07/11/2017   HCT 32.3 (L) 07/11/2017   MCV 93.6 07/11/2017   PLT 195 07/11/2017   CMP Latest Ref Rng & Units 07/11/2017  Glucose 65 - 99 mg/dL 782(N)  BUN 6 - 20 mg/dL 56(O)  Creatinine 1.30 - 1.00 mg/dL 8.65  Sodium 784 - 696 mmol/L 133(L)  Potassium 3.5 - 5.1 mmol/L 4.3  Chloride 101 - 111 mmol/L 100(L)  CO2 22 - 32 mmol/L 25  Calcium 8.9 - 10.3 mg/dL 2.9(B)  Total Protein 6.5 - 8.1 g/dL 2.8(U)  Total Bilirubin 0.3 - 1.2 mg/dL 0.4  Alkaline Phos 38 - 126 U/L 243(H)  AST 15 - 41 U/L 27  ALT 14 - 54 U/L 13(L)    Discharge instruction: per After Visit Summary and "Baby and Me Booklet".  After visit meds:  Allergies as of 07/13/2017      Reactions   Spinach Anaphylaxis  Throat closes up   Sulfa Antibiotics Rash   Zithromax [azithromycin] Rash      Medication List    STOP taking these medications   aspirin EC 81 MG tablet     TAKE these medications   amLODipine 5 MG tablet Commonly known as:  NORVASC Take 1 tablet (5 mg total) by mouth daily.   BONINE PO Take 25 mg by mouth daily as needed (nausea).   ferrous sulfate 325 (65 FE) MG tablet Take 1 tablet (325 mg total) by mouth 2 (two) times daily with a meal.   ibuprofen 600 MG tablet Commonly known as:  ADVIL,MOTRIN Take 1 tablet (600 mg total) by mouth every 6 (six) hours.   oxyCODONE-acetaminophen 5-325 MG tablet Commonly known as:  PERCOCET/ROXICET Take 1 tablet by mouth every 4 (four) hours as needed (pain scale 4-7).   prenatal multivitamin Tabs tablet Take 1 tablet by mouth daily at 12 noon.   senna-docusate 8.6-50 MG tablet Commonly known as:  Senokot-S Take 2 tablets by mouth daily.            Discharge Care Instructions        Start     Ordered   07/14/17 0000  senna-docusate (SENOKOT-S) 8.6-50 MG tablet  Every 24 hours     07/13/17 0622   07/13/17 0000  amLODipine (NORVASC) 5 MG tablet  Daily     07/13/17 0622    07/13/17 0000  ferrous sulfate 325 (65 FE) MG tablet  2 times daily with meals     07/13/17 0622   07/13/17 0000  ibuprofen (ADVIL,MOTRIN) 600 MG tablet  Every 6 hours     07/13/17 0622   07/13/17 0000  oxyCODONE-acetaminophen (PERCOCET/ROXICET) 5-325 MG tablet  Every 4 hours PRN     07/13/17 0622   07/13/17 0000  Diet - low sodium heart healthy     07/13/17 0622   07/13/17 0000  Call MD for:  temperature >100.4     07/13/17 0622   07/13/17 0000  Call MD for:  persistant nausea and vomiting     07/13/17 0622   07/13/17 0000  Call MD for:  severe uncontrolled pain     07/13/17 0622   07/13/17 0000  Call MD for:  redness, tenderness, or signs of infection (pain, swelling, redness, odor or green/yellow discharge around incision site)     07/13/17 0622   07/13/17 0000  Sexual acrtivity    Comments:  No sex for 4-6 weeks   07/13/17 0622   07/13/17 0000  Driving restriction     Comments:  Avoid driving for at least 4 weeks.   07/13/17 0622   07/13/17 0000  Activity as tolerated     07/13/17 0622   07/13/17 0000  Call MD for:  extreme fatigue     07/13/17 0622   07/13/17 0000  Call MD for:  difficulty breathing, headache or visual disturbances     07/13/17 0622   07/10/17 0000  OB RESULTS CONSOLE Hepatitis B surface antigen    Comments:  This external order was created through the Results Console.    07/10/17 1209   07/08/17 0000  OB RESULTS CONSOLE Rubella Antibody    Comments:  This external order was created through the Results Console.    07/08/17 1709      Diet: routine diet  Activity: Advance as tolerated. Pelvic rest for 6 weeks.   Outpatient follow up:4 weeks Follow up Appt:Future Appointments Date Time Provider Department  Center  08/24/2017 1:20 PM Kooistra, Charlesetta GaribaldiKathryn Lorraine, CNM [facility]   Follow up Visit:No Follow-up on file.  Postpartum contraception: Depo Provera  Newborn Data: Live born female  Birth Weight: 5 lb 3.6 oz (2370 g) APGAR: 7, 8  Baby  Feeding: Bottle Disposition:home with mother   07/13/2017 Levie HeritageJacob J Corina Stacy, DO

## 2017-07-13 NOTE — Discharge Instructions (Signed)

## 2017-07-13 NOTE — Progress Notes (Signed)
Patient discharged with printed instructions. Pt verbalized an understanding. No concerns noted. Sonya Clasby L Promise Bushong, RN 

## 2017-08-24 ENCOUNTER — Ambulatory Visit (INDEPENDENT_AMBULATORY_CARE_PROVIDER_SITE_OTHER): Payer: Managed Care, Other (non HMO) | Admitting: Student

## 2017-08-24 VITALS — BP 113/75 | HR 86 | Ht 67.0 in | Wt 141.9 lb

## 2017-08-24 DIAGNOSIS — Z30013 Encounter for initial prescription of injectable contraceptive: Secondary | ICD-10-CM

## 2017-08-24 DIAGNOSIS — Z3202 Encounter for pregnancy test, result negative: Secondary | ICD-10-CM

## 2017-08-24 DIAGNOSIS — Z3042 Encounter for surveillance of injectable contraceptive: Secondary | ICD-10-CM | POA: Diagnosis not present

## 2017-08-24 DIAGNOSIS — Z1389 Encounter for screening for other disorder: Secondary | ICD-10-CM | POA: Diagnosis not present

## 2017-08-24 HISTORY — DX: Encounter for initial prescription of injectable contraceptive: Z30.013

## 2017-08-24 LAB — POCT PREGNANCY, URINE: Preg Test, Ur: NEGATIVE

## 2017-08-24 MED ORDER — MEDROXYPROGESTERONE ACETATE 150 MG/ML IM SUSP
150.0000 mg | Freq: Once | INTRAMUSCULAR | Status: AC
  Administered 2017-08-24: 150 mg via INTRAMUSCULAR

## 2017-08-24 NOTE — Progress Notes (Signed)
Subjective:     Sonya Green is a 26 y.o. female who presents for a postpartum visit. She is 6 weeks postpartum following a low cervical transverse Cesarean section. I have fully reviewed the prenatal and intrapartum course. The delivery was at 36 gestational weeks. Outcome: primary cesarean section, low transverse incision. Anesthesia: epidural. Postpartum course has been uneventful. Baby's course has been uneventful. Baby is feeding by both breast and bottle - Similac Advance. Bleeding no bleeding. Bowel function is normal. Bladder function is normal. Patient is sexually active. Contraception method is [name] injections. Postpartum depression screening: negative.  The following portions of the patient's history were reviewed and updated as appropriate: allergies, current medications, past family history, past medical history, past social history, past surgical history and problem list.  Review of Systems Pertinent items are noted in HPI.   Objective:    BP 113/75   Pulse 86   Ht 5\' 7"  (1.702 m)   Wt 141 lb 14.4 oz (64.4 kg)   LMP 08/16/2017 (Approximate)   BMI 22.22 kg/m   General:  alert, cooperative and no distress   Breasts:  inspection negative, no nipple discharge or bleeding, no masses or nodularity palpable  Lungs: clear to auscultation bilaterally  Heart:  regular rate and rhythm, S1, S2 normal, no murmur, click, rub or gallop  Abdomen: soft, non-tender; bowel sounds normal; no masses,  no organomegaly   Vulva:  not evaluated  Vagina: not evaluated  Cervix:  not evaluated  Corpus: not examined  Adnexa:  not evaluated  Rectal Exam: Not performed.        Assessment:    Normal postpartum exam. Pap smear not done at today's visit.   Plan:    1. Contraception: [name] injections. Received Depo today; patient should take pregnancy test in two weeks as she had unprotected sex two weeks ago.  2. Blood pressure is ok today, no HA or blurry vision. D/C Norvasc.  3.  Follow up in: 3 month or as needed.

## 2017-08-24 NOTE — Patient Instructions (Signed)
Etonogestrel implant What is this medicine? ETONOGESTREL (et oh noe JES trel) is a contraceptive (birth control) device. It is used to prevent pregnancy. It can be used for up to 3 years. This medicine may be used for other purposes; ask your health care provider or pharmacist if you have questions. COMMON BRAND NAME(S): Implanon, Nexplanon What should I tell my health care provider before I take this medicine? They need to know if you have any of these conditions: -abnormal vaginal bleeding -blood vessel disease or blood clots -cancer of the breast, cervix, or liver -depression -diabetes -gallbladder disease -headaches -heart disease or recent heart attack -high blood pressure -high cholesterol -kidney disease -liver disease -renal disease -seizures -tobacco smoker -an unusual or allergic reaction to etonogestrel, other hormones, anesthetics or antiseptics, medicines, foods, dyes, or preservatives -pregnant or trying to get pregnant -breast-feeding How should I use this medicine? This device is inserted just under the skin on the inner side of your upper arm by a health care professional. Talk to your pediatrician regarding the use of this medicine in children. Special care may be needed. Overdosage: If you think you have taken too much of this medicine contact a poison control center or emergency room at once. NOTE: This medicine is only for you. Do not share this medicine with others. What if I miss a dose? This does not apply. What may interact with this medicine? Do not take this medicine with any of the following medications: -amprenavir -bosentan -fosamprenavir This medicine may also interact with the following medications: -barbiturate medicines for inducing sleep or treating seizures -certain medicines for fungal infections like ketoconazole and itraconazole -grapefruit juice -griseofulvin -medicines to treat seizures like carbamazepine, felbamate, oxcarbazepine,  phenytoin, topiramate -modafinil -phenylbutazone -rifampin -rufinamide -some medicines to treat HIV infection like atazanavir, indinavir, lopinavir, nelfinavir, tipranavir, ritonavir -St. John's wort This list may not describe all possible interactions. Give your health care provider a list of all the medicines, herbs, non-prescription drugs, or dietary supplements you use. Also tell them if you smoke, drink alcohol, or use illegal drugs. Some items may interact with your medicine. What should I watch for while using this medicine? This product does not protect you against HIV infection (AIDS) or other sexually transmitted diseases. You should be able to feel the implant by pressing your fingertips over the skin where it was inserted. Contact your doctor if you cannot feel the implant, and use a non-hormonal birth control method (such as condoms) until your doctor confirms that the implant is in place. If you feel that the implant may have broken or become bent while in your arm, contact your healthcare provider. What side effects may I notice from receiving this medicine? Side effects that you should report to your doctor or health care professional as soon as possible: -allergic reactions like skin rash, itching or hives, swelling of the face, lips, or tongue -breast lumps -changes in emotions or moods -depressed mood -heavy or prolonged menstrual bleeding -pain, irritation, swelling, or bruising at the insertion site -scar at site of insertion -signs of infection at the insertion site such as fever, and skin redness, pain or discharge -signs of pregnancy -signs and symptoms of a blood clot such as breathing problems; changes in vision; chest pain; severe, sudden headache; pain, swelling, warmth in the leg; trouble speaking; sudden numbness or weakness of the face, arm or leg -signs and symptoms of liver injury like dark yellow or brown urine; general ill feeling or flu-like symptoms;  light-colored   stools; loss of appetite; nausea; right upper belly pain; unusually weak or tired; yellowing of the eyes or skin -unusual vaginal bleeding, discharge -signs and symptoms of a stroke like changes in vision; confusion; trouble speaking or understanding; severe headaches; sudden numbness or weakness of the face, arm or leg; trouble walking; dizziness; loss of balance or coordination Side effects that usually do not require medical attention (report to your doctor or health care professional if they continue or are bothersome): -acne -back pain -breast pain -changes in weight -dizziness -general ill feeling or flu-like symptoms -headache -irregular menstrual bleeding -nausea -sore throat -vaginal irritation or inflammation This list may not describe all possible side effects. Call your doctor for medical advice about side effects. You may report side effects to FDA at 1-800-FDA-1088. Where should I keep my medicine? This drug is given in a hospital or clinic and will not be stored at home. NOTE: This sheet is a summary. It may not cover all possible information. If you have questions about this medicine, talk to your doctor, pharmacist, or health care provider.  2018 Elsevier/Gold Standard (2016-05-13 11:19:22)  

## 2017-11-09 ENCOUNTER — Ambulatory Visit (INDEPENDENT_AMBULATORY_CARE_PROVIDER_SITE_OTHER): Payer: Managed Care, Other (non HMO) | Admitting: General Practice

## 2017-11-09 VITALS — BP 140/65 | HR 71 | Ht 67.0 in | Wt 145.0 lb

## 2017-11-09 DIAGNOSIS — Z3042 Encounter for surveillance of injectable contraceptive: Secondary | ICD-10-CM | POA: Diagnosis not present

## 2017-11-09 MED ORDER — MEDROXYPROGESTERONE ACETATE 150 MG/ML IM SUSP
150.0000 mg | Freq: Once | INTRAMUSCULAR | Status: AC
  Administered 2017-11-09: 150 mg via INTRAMUSCULAR

## 2017-11-09 NOTE — Progress Notes (Signed)
Depo provera given. Recommended to patient she make an appt with PCP due to elevated BP.

## 2017-11-09 NOTE — Progress Notes (Signed)
Chart reviewed for nurse visit. Agree with plan of care.   Judeth HornLawrence, Curtiss Mahmood, NP 11/09/2017 1:20 PM

## 2018-01-18 ENCOUNTER — Emergency Department (HOSPITAL_COMMUNITY)
Admission: EM | Admit: 2018-01-18 | Discharge: 2018-01-18 | Disposition: A | Discharge status: Home / Self Care | Payer: Managed Care, Other (non HMO) | Attending: Emergency Medicine | Admitting: Emergency Medicine

## 2018-01-18 ENCOUNTER — Emergency Department (HOSPITAL_COMMUNITY): Payer: Managed Care, Other (non HMO)

## 2018-01-18 ENCOUNTER — Encounter (HOSPITAL_COMMUNITY): Payer: Self-pay | Admitting: Emergency Medicine

## 2018-01-18 ENCOUNTER — Other Ambulatory Visit: Payer: Self-pay

## 2018-01-18 DIAGNOSIS — R1011 Right upper quadrant pain: Secondary | ICD-10-CM | POA: Diagnosis present

## 2018-01-18 DIAGNOSIS — K8050 Calculus of bile duct without cholangitis or cholecystitis without obstruction: Secondary | ICD-10-CM

## 2018-01-18 LAB — CBC WITH DIFFERENTIAL/PLATELET
BASOS ABS: 0.1 10*3/uL (ref 0.0–0.1)
BASOS PCT: 1 %
EOS PCT: 2 %
Eosinophils Absolute: 0.1 10*3/uL (ref 0.0–0.7)
HEMATOCRIT: 41 % (ref 36.0–46.0)
Hemoglobin: 14.5 g/dL (ref 12.0–15.0)
Lymphocytes Relative: 43 %
Lymphs Abs: 2 10*3/uL (ref 0.7–4.0)
MCH: 31.6 pg (ref 26.0–34.0)
MCHC: 35.4 g/dL (ref 30.0–36.0)
MCV: 89.3 fL (ref 78.0–100.0)
MONO ABS: 0.3 10*3/uL (ref 0.1–1.0)
MONOS PCT: 5 %
NEUTROS ABS: 2.4 10*3/uL (ref 1.7–7.7)
Neutrophils Relative %: 49 %
PLATELETS: 290 10*3/uL (ref 150–400)
RBC: 4.59 MIL/uL (ref 3.87–5.11)
RDW: 11.8 % (ref 11.5–15.5)
WBC: 4.8 10*3/uL (ref 4.0–10.5)

## 2018-01-18 LAB — COMPREHENSIVE METABOLIC PANEL
ALBUMIN: 4.3 g/dL (ref 3.5–5.0)
ALT: 10 U/L — ABNORMAL LOW (ref 14–54)
ANION GAP: 9 (ref 5–15)
AST: 13 U/L — ABNORMAL LOW (ref 15–41)
Alkaline Phosphatase: 54 U/L (ref 38–126)
BILIRUBIN TOTAL: 0.6 mg/dL (ref 0.3–1.2)
BUN: 12 mg/dL (ref 6–20)
CHLORIDE: 108 mmol/L (ref 101–111)
CO2: 26 mmol/L (ref 22–32)
Calcium: 9.5 mg/dL (ref 8.9–10.3)
Creatinine, Ser: 0.76 mg/dL (ref 0.44–1.00)
GFR calc Af Amer: >60 mL/min (ref >60)
GLUCOSE: 93 mg/dL (ref 65–99)
POTASSIUM: 3.7 mmol/L (ref 3.5–5.1)
Sodium: 143 mmol/L (ref 135–145)
TOTAL PROTEIN: 8.2 g/dL — AB (ref 6.5–8.1)

## 2018-01-18 LAB — URINALYSIS, ROUTINE W REFLEX MICROSCOPIC
BILIRUBIN URINE: NEGATIVE
Glucose, UA: NEGATIVE mg/dL
HGB URINE DIPSTICK: NEGATIVE
KETONES UR: 5 mg/dL — AB
NITRITE: NEGATIVE
Protein, ur: NEGATIVE mg/dL
SPECIFIC GRAVITY, URINE: 1.026 (ref 1.005–1.030)
pH: 6 (ref 5.0–8.0)

## 2018-01-18 LAB — I-STAT BETA HCG BLOOD, ED (MC, WL, AP ONLY)

## 2018-01-18 LAB — LIPASE, BLOOD: LIPASE: 41 U/L (ref 11–51)

## 2018-01-18 MED ORDER — SODIUM CHLORIDE 0.9 % IV BOLUS (SEPSIS)
1000.0000 mL | Freq: Once | INTRAVENOUS | Status: AC
  Administered 2018-01-18: 1000 mL via INTRAVENOUS

## 2018-01-18 MED ORDER — ONDANSETRON HCL 4 MG PO TABS: 4.0000 mg | ORAL_TABLET | Freq: Three times a day (TID) | ORAL | 0 refills | Status: DC | PRN

## 2018-01-18 MED ORDER — TRAMADOL HCL 50 MG PO TABS: 50.0000 mg | ORAL_TABLET | Freq: Four times a day (QID) | ORAL | 0 refills | Status: DC | PRN

## 2018-01-18 MED ORDER — MORPHINE SULFATE (PF) 4 MG/ML IV SOLN
4.0000 mg | Freq: Once | INTRAVENOUS | Status: AC
  Administered 2018-01-18: 4 mg via INTRAVENOUS
  Filled 2018-01-18: qty 1

## 2018-01-18 MED ORDER — ONDANSETRON HCL 4 MG/2ML IJ SOLN
4.0000 mg | Freq: Once | INTRAMUSCULAR | Status: AC
  Administered 2018-01-18: 4 mg via INTRAVENOUS
  Filled 2018-01-18: qty 2

## 2018-01-18 NOTE — Discharge Instructions (Signed)
You were seen here today for right upper quadrant abdominal pain.  Your lab tests and ultrasound were consistent with biliary colic.  I have attached a handout on this.  Please follow diet recommendations as we discussed.  Please take Zofran as needed for nausea.  Please call [facility] tomorrow to schedule a appointment.  They will discuss further how they would like to treat this.   Get help right away if: You have sudden pain in the upper right side of your belly, and it lasts for more than 2 hours. You have belly pain that lasts for more than 5 hours. You have a fever or chills. You keep feeling sick to your stomach or you keep throwing up. Your skin or the whites of your eyes turn yellow (jaundice). You have dark-colored pee (urine). You have light-colored poop (stool).  For pain control you may take: 800mg  of ibuprofen (that is usually four 200mg  over the counter pills) up to 3 times a day (please take with food) and acetaminophen 975mg  (this is 3 normal strength, 325mg , over the counter pills) up to four times a day. Please do not take more than this. Do not drink alcohol or combine with other medications that have acetaminophen or Ibuprofen as an ingredient (Read the labels!).

## 2018-01-18 NOTE — ED Triage Notes (Signed)
Pt complaint of right ribcage pain; recently had cough; denies injury.

## 2018-01-18 NOTE — ED Notes (Signed)
Patient assisted to restroom with wheelchair.

## 2018-01-18 NOTE — ED Provider Notes (Signed)
[facility]-EMERGENCY DEPT Provider Note   CSN: [id number] Arrival date & time: 01/18/18  1337     History   Chief Complaint Chief Complaint  Patient presents with  . Ribcage Pain    HPI Sonya Green is a 27 y.o. female who presents to the emergency department today for right upper quadrant abdominal pain.  Patient states that for the last 2 weeks she has been having intermittent right upper quadrant abdominal pain with radiation to her back that commonly occurs after eating greasy food.  She notes that the pain typically lasts for 20 minutes to 1-2 hour and describes it as someone taking a sharp needle and stabbing her in the stomach.  She reports nausea without associated emesis.  She has not taken any over-the-counter medications for this at home.  She notes that when her pain is at its worst it is increased when bending down to pick up her newborn.  Triage note reports that the patient is having rib cage pain after cough.  She denies this to myself.  She states she had a recent URI several weeks ago but never had pain after coughing.  She believes these are completely separate and not the cause of her pain.  Patient's last pain episode of pain was this morning and lasted for approximately 45 minutes after breakfast.  Patient denies any alcohol use or marijuana use.  No chronic NSAID use.  History of stomach ulcers.  Patient denies fever, chills, chest pain, shortness of breath, cough, hemoptysis, lower abdominal pain, diarrhea, hematochezia, melena, dysuria, urinary frequency, urinary urgency, or hematuria.  Patient's last menstrual cycle was 01/11/2018.  Notes she did have a exploratory surgery as a child for biliary jaundice.  She is also had a C-section recently.  No other abdominal surgeries in the past.  Last bowel movement was today and normal.   HPI  Past Medical History:  Diagnosis Date  . Jaundice of newborn   . Migraines     Patient Active Problem List     Diagnosis Date Noted  . Postpartum care and examination 08/24/2017  . Encounter for initial prescription of injectable contraceptive 08/24/2017  . Status post cesarean section 07/13/2017  . UTI in pregnancy 07/01/2017    Past Surgical History:  Procedure Laterality Date  . CESAREAN SECTION N/A 07/10/2017   Procedure: CESAREAN SECTION;  Surgeon: Tereso Newcomer, MD;  Location: [facility];  Service: Obstetrics;  Laterality: N/A;  . WISDOM TOOTH EXTRACTION      OB History    Gravida Para Term Preterm AB Living   1 1   1   1    SAB TAB Ectopic Multiple Live Births         0 1       Home Medications    Prior to Admission medications   Medication Sig Start Date End Date Taking? Authorizing Provider  amLODipine (NORVASC) 5 MG tablet Take 1 tablet (5 mg total) by mouth daily. 07/13/17  Yes Levie Heritage, DO  ferrous sulfate 325 (65 FE) MG tablet Take 1 tablet (325 mg total) by mouth 2 (two) times daily with a meal. 07/13/17  Yes Levie Heritage, DO  Multiple Vitamin (MULTIVITAMIN WITH MINERALS) TABS tablet Take 1 tablet by mouth daily.   Yes [provider]  ibuprofen (ADVIL,MOTRIN) 600 MG tablet Take 1 tablet (600 mg total) by mouth every 6 (six) hours. Patient not taking: Reported on 08/24/2017 07/13/17   Levie Heritage, DO  oxyCODONE-acetaminophen (PERCOCET/ROXICET) 5-325 MG tablet Take 1 tablet by mouth every 4 (four) hours as needed (pain scale 4-7). Patient not taking: Reported on 08/24/2017 07/13/17   Levie Heritage, DO  senna-docusate (SENOKOT-S) 8.6-50 MG tablet Take 2 tablets by mouth daily. Patient not taking: Reported on 08/24/2017 07/14/17   Levie Heritage, DO    Family History Family History  Problem Relation Age of Onset  . Healthy Mother   . Arthritis Maternal Grandmother   . Stroke Maternal Grandmother   . Diabetes Maternal Grandmother   . Lung cancer Maternal Grandfather   . Diabetes Paternal Grandmother     Social History Social History    Tobacco Use  . Smoking status: Never Smoker  . Smokeless tobacco: Never Used  Substance Use Topics  . Alcohol use: No    Comment: Occasionally  . Drug use: No     Allergies   Spinach; Sulfa antibiotics; and Zithromax [azithromycin]   Review of Systems Review of Systems  All other systems reviewed and are negative.    Physical Exam Updated Vital Signs BP 128/67 (BP Location: Left Arm)   Pulse (!) 53   Temp 98.1 F (36.7 C) (Oral)   Resp 16   LMP 01/11/2018 (Approximate)   SpO2 100%   Physical Exam  Constitutional: She appears well-developed and well-nourished.  HENT:  Head: Normocephalic and atraumatic.  Right Ear: External ear normal.  Left Ear: External ear normal.  Nose: Nose normal.  Mouth/Throat: Uvula is midline, oropharynx is clear and moist and mucous membranes are normal. No tonsillar exudate.  Eyes: Pupils are equal, round, and reactive to light. Right eye exhibits no discharge. Left eye exhibits no discharge. No scleral icterus.  Neck: Trachea normal. Neck supple. No spinous process tenderness present. No neck rigidity. Normal range of motion present.  Cardiovascular: Normal rate, regular rhythm and intact distal pulses.  No murmur heard. Pulses:      Radial pulses are 2+ on the right side, and 2+ on the left side.       Dorsalis pedis pulses are 2+ on the right side, and 2+ on the left side.       Posterior tibial pulses are 2+ on the right side, and 2+ on the left side.  No lower extremity swelling or edema. Calves symmetric in size bilaterally.  Pulmonary/Chest: Effort normal and breath sounds normal. She has no decreased breath sounds. She has no wheezes. She has no rhonchi. She has no rales. She exhibits no tenderness.  No increased work of breathing. No accessory muscle use. Patient is sitting upright, speaking in full sentences without difficulty.  No crepitus or tenderness palpation of the chest wall.  Abdominal: Soft. Bowel sounds are normal.  She exhibits no distension. There is no hepatosplenomegaly. There is tenderness in the right upper quadrant and epigastric area. There is positive Murphy's sign. There is no rigidity, no rebound, no guarding, no CVA tenderness and no tenderness at McBurney's point. No hernia.  Prior abdominal scar of the right upper abdomen without signs of infection.  Musculoskeletal: She exhibits no edema.  Lymphadenopathy:    She has no cervical adenopathy.  Neurological: She is alert.  Skin: Skin is warm, dry and intact. Capillary refill takes less than 2 seconds. No rash noted. She is not diaphoretic.  Psychiatric: She has a normal mood and affect.  Nursing note and vitals reviewed.    ED Treatments / Results  Labs (all labs ordered are listed, but only abnormal results  are displayed) Labs Reviewed - No data to display  EKG  EKG Interpretation None       Radiology Dg Chest 2 View  Result Date: 01/18/2018 CLINICAL DATA:  Rib cage pain EXAM: CHEST - 2 VIEW COMPARISON:  06/18/2017 FINDINGS: Heart and mediastinal contours are within normal limits. No focal opacities or effusions. No acute bony abnormality. No visible rib fracture. No pneumothorax. IMPRESSION: No active cardiopulmonary disease. Electronically Signed   By: Charlett NoseKevin  Dover M.D.   On: 01/18/2018 14:36    Procedures Procedures (including critical care time)  Medications Ordered in ED Medications  sodium chloride 0.9 % bolus 1,000 mL (0 mLs Intravenous Stopped 01/18/18 2009)  ondansetron (ZOFRAN) injection 4 mg (4 mg Intravenous Given 01/18/18 1806)  morphine 4 MG/ML injection 4 mg (4 mg Intravenous Given 01/18/18 1806)     Initial Impression / Assessment and Plan / ED Course  I have reviewed the triage vital signs and the nursing notes.  Pertinent labs & imaging results that were available during my care of the patient were reviewed by me and considered in my medical decision making (see chart for details).     This is a  27 year old female presenting for symptoms consistent with biliary colic.  She notes right upper quadrant abdominal pain with associated nausea that occurs after eating greasy foods.  Patient was triaged as rib cage pain after coughing but she denies this to myself.  X-ray taken in triage without evidence of active cardiopulmonary disease.  Patient's vitals are reassuring on presentation.  She is without fever, tachypnea, tachycardia, hypoxia or hypotension.  She is non-ill and nonseptic appearing.  On abdominal exam patient does have tenderness palpation of the epigastric and right upper quadrant without peritoneal signs.  There is a positive Murphy sign.  Will order right upper quadrant ultrasound, blood work, IV fluids, IV nausea medication and pain medication.  Patient does report she is currently not breast-feeding and is okay to receive these medications.  Patient's last p.o. intake at 9 AM this morning.  Patient's urinalysis not suggestive of UTI.  There is trace leukocytes and few bacteria but this is not a clean catch with 0-5 squamous epithelial cells.  She is also not complaining of any urinary symptoms at this time.  Patient's pregnancy test is negative.  Patient is without leukocytosis.  No anemia.  No significant electrolyte derangements.  Kidney function within normal limits.  Patient's LFTs without elevation.  Lipase within normal limits.  Right upper quadrant ultrasound with small amount of intraluminal biliary sludge without secondary signs of acute cholecystitis.  No gallbladder wall thickening, pericholecystic fluid.  Patients bile duct within normal limits.  Repeat abdominal exam the patient's is without significant tenderness palpation or peritoneal signs.  Patient lab work and imaging not suggestive of cholecystitis given no white count, normal LFTs, no gallbladder wall thickening or pericholecystic fluid.  Patient symptoms consistent with biliary colic with sludge in the gallbladder,  history and exam findings.  [facility].  Will give Zofran at home for nausea.  Recommended Tylenol and ibuprofen OTC as needed for pain.  Recommended dietary changes.  Informational handout given. Time was given for all questions to be answered. The patient verbalized understanding and agreement with plan. The patient appears safe for discharge home.  Final Clinical Impressions(s) / ED Diagnoses   Final diagnoses:  RUQ abdominal pain  Biliary colic    ED Discharge Orders  Ordered    ondansetron (ZOFRAN) 4 MG tablet  Every 8 hours PRN     01/18/18 2013    traMADol (ULTRAM) 50 MG tablet  Every 6 hours PRN,   Status:  Discontinued     01/18/18 2013       Princella Pellegrini 01/18/18 2228    Linwood Dibbles, MD 01/20/18 [phone]

## 2018-01-25 ENCOUNTER — Ambulatory Visit (INDEPENDENT_AMBULATORY_CARE_PROVIDER_SITE_OTHER): Payer: Managed Care, Other (non HMO)

## 2018-01-25 VITALS — BP 110/66 | HR 77 | Wt 141.0 lb

## 2018-01-25 DIAGNOSIS — Z3042 Encounter for surveillance of injectable contraceptive: Secondary | ICD-10-CM

## 2018-01-25 MED ORDER — MEDROXYPROGESTERONE ACETATE 150 MG/ML IM SUSP
150.0000 mg | Freq: Once | INTRAMUSCULAR | Status: AC
  Administered 2018-01-25: 150 mg via INTRAMUSCULAR

## 2018-01-25 NOTE — Progress Notes (Signed)
I have reviewed the nurse's note, and agree with POC.  Thressa ShellerHeather Hogan 11:23 AM 01/25/18

## 2018-01-25 NOTE — Progress Notes (Signed)
Pt presents to the office for a depo injection.Pt tolerated well. Next depo is due June 5-June 19.

## 2018-04-14 ENCOUNTER — Other Ambulatory Visit: Payer: Self-pay

## 2018-04-14 ENCOUNTER — Ambulatory Visit (INDEPENDENT_AMBULATORY_CARE_PROVIDER_SITE_OTHER): Payer: Managed Care, Other (non HMO)

## 2018-04-14 VITALS — BP 123/68 | HR 88 | Wt 139.0 lb

## 2018-04-14 DIAGNOSIS — Z3042 Encounter for surveillance of injectable contraceptive: Secondary | ICD-10-CM | POA: Diagnosis not present

## 2018-04-14 MED ORDER — MEDROXYPROGESTERONE ACETATE 150 MG/ML IM SUSP
150.0000 mg | Freq: Once | INTRAMUSCULAR | Status: AC
  Administered 2018-04-14: 150 mg via INTRAMUSCULAR

## 2018-04-14 NOTE — Progress Notes (Signed)
Date last pap: 05/17/2017. Last [name]: 01/25/2018. Side Effects if any: none. Serum HCG indicated? n/a. [name] 150 mg IM given by: S.Levens,CMA(AAMA). Next appointment due Aug 23-Sept 6.

## 2018-04-14 NOTE — Patient Instructions (Signed)
Next appointment due Aug 23-Sept 6

## 2018-06-26 ENCOUNTER — Other Ambulatory Visit: Payer: Self-pay

## 2018-06-26 ENCOUNTER — Encounter (HOSPITAL_COMMUNITY): Payer: Self-pay | Admitting: *Deleted

## 2018-06-26 ENCOUNTER — Emergency Department (HOSPITAL_COMMUNITY)
Admission: EM | Admit: 2018-06-26 | Discharge: 2018-06-26 | Disposition: A | Discharge status: Home / Self Care | Payer: Managed Care, Other (non HMO) | Attending: Emergency Medicine | Admitting: Emergency Medicine

## 2018-06-26 ENCOUNTER — Emergency Department (HOSPITAL_COMMUNITY): Payer: Managed Care, Other (non HMO)

## 2018-06-26 DIAGNOSIS — R51 Headache: Secondary | ICD-10-CM | POA: Insufficient documentation

## 2018-06-26 DIAGNOSIS — M791 Myalgia, unspecified site: Secondary | ICD-10-CM | POA: Insufficient documentation

## 2018-06-26 DIAGNOSIS — R519 Headache, unspecified: Secondary | ICD-10-CM

## 2018-06-26 DIAGNOSIS — M542 Cervicalgia: Secondary | ICD-10-CM | POA: Diagnosis not present

## 2018-06-26 LAB — POC URINE PREG, ED: PREG TEST UR: NEGATIVE

## 2018-06-26 MED ORDER — ACETAMINOPHEN 500 MG PO TABS: 500.0000 mg | ORAL_TABLET | Freq: Four times a day (QID) | ORAL | 0 refills | Status: DC | PRN

## 2018-06-26 MED ORDER — IBUPROFEN 600 MG PO TABS: 600.0000 mg | ORAL_TABLET | Freq: Four times a day (QID) | ORAL | 0 refills | Status: DC | PRN

## 2018-06-26 NOTE — Discharge Instructions (Addendum)
Alternate 600 mg of ibuprofen and [phone] mg of Tylenol every 3 hours as needed for pain. Do not exceed 4000 mg of Tylenol daily. Ice to areas of soreness for the next few days and then may move to heat. Take hot showers or hot baths and do some gentle stretching to ease muscle stiffness. Take short, frequent walks throughout the day to avoid stiffness and pain. Expect to be sore for the next few day and follow up with primary care physician for recheck of ongoing symptoms but return to ER for emergent changing or worsening of symptoms such as loss of control of bowel or bladder, altered mental status, or abnormal gait not due to pain.
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
## 2018-06-26 NOTE — ED Notes (Signed)
ED Provider at bedside. 
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
## 2018-06-28 ENCOUNTER — Other Ambulatory Visit: Payer: Self-pay

## 2018-06-28 ENCOUNTER — Encounter (HOSPITAL_COMMUNITY): Payer: Self-pay | Admitting: Emergency Medicine

## 2018-06-28 ENCOUNTER — Emergency Department (HOSPITAL_COMMUNITY)
Admission: EM | Admit: 2018-06-28 | Discharge: 2018-06-28 | Disposition: A | Discharge status: Home / Self Care | Payer: Managed Care, Other (non HMO) | Attending: Emergency Medicine | Admitting: Emergency Medicine

## 2018-06-28 DIAGNOSIS — Z5321 Procedure and treatment not carried out due to patient leaving prior to being seen by health care provider: Secondary | ICD-10-CM | POA: Diagnosis not present

## 2018-06-28 DIAGNOSIS — R51 Headache: Secondary | ICD-10-CM | POA: Insufficient documentation

## 2018-06-28 NOTE — ED Triage Notes (Signed)
Patient c/o headache with light sensitivity and nausea x "a few hours." Hx migraines.

## 2018-06-28 NOTE — ED Notes (Signed)
Patient told registration she was leaving 

## 2018-06-29 ENCOUNTER — Encounter (HOSPITAL_COMMUNITY): Payer: Self-pay

## 2018-06-29 ENCOUNTER — Emergency Department (HOSPITAL_COMMUNITY)
Admission: EM | Admit: 2018-06-29 | Discharge: 2018-06-29 | Disposition: A | Discharge status: Home / Self Care | Payer: Managed Care, Other (non HMO) | Attending: Emergency Medicine | Admitting: Emergency Medicine

## 2018-06-29 ENCOUNTER — Other Ambulatory Visit: Payer: Self-pay

## 2018-06-29 DIAGNOSIS — G43909 Migraine, unspecified, not intractable, without status migrainosus: Secondary | ICD-10-CM | POA: Insufficient documentation

## 2018-06-29 DIAGNOSIS — R51 Headache: Secondary | ICD-10-CM | POA: Diagnosis present

## 2018-06-29 DIAGNOSIS — Z79899 Other long term (current) drug therapy: Secondary | ICD-10-CM | POA: Insufficient documentation

## 2018-06-29 DIAGNOSIS — Z7982 Long term (current) use of aspirin: Secondary | ICD-10-CM | POA: Diagnosis not present

## 2018-06-29 MED ORDER — DIPHENHYDRAMINE HCL 25 MG PO CAPS: 25.0000 mg | ORAL_CAPSULE | Freq: Once | ORAL | Status: DC

## 2018-06-29 MED ORDER — KETOROLAC TROMETHAMINE 30 MG/ML IJ SOLN
30.0000 mg | Freq: Once | INTRAMUSCULAR | Status: AC
  Administered 2018-06-29: 30 mg via INTRAMUSCULAR
  Filled 2018-06-29: qty 1

## 2018-06-29 MED ORDER — METOCLOPRAMIDE HCL 10 MG PO TABS
5.0000 mg | ORAL_TABLET | Freq: Once | ORAL | Status: AC
  Administered 2018-06-29: 5 mg via ORAL
  Filled 2018-06-29: qty 1
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
## 2018-06-29 NOTE — Discharge Instructions (Addendum)
Please read attached information. If you experience any new or worsening signs or symptoms please return to the emergency room for evaluation. Please follow-up with your primary care provider or specialist as discussed. Please use tylenol or ibuprofen as needed for pain.  °

## 2018-07-03 ENCOUNTER — Ambulatory Visit: Payer: Managed Care, Other (non HMO)

## 2018-07-04 ENCOUNTER — Ambulatory Visit (INDEPENDENT_AMBULATORY_CARE_PROVIDER_SITE_OTHER): Payer: Managed Care, Other (non HMO) | Admitting: Medical

## 2018-07-04 ENCOUNTER — Encounter: Payer: Self-pay | Admitting: Medical

## 2018-07-04 VITALS — BP 124/78 | HR 109 | Ht 67.0 in | Wt 141.8 lb

## 2018-07-04 DIAGNOSIS — Z3042 Encounter for surveillance of injectable contraceptive: Secondary | ICD-10-CM

## 2018-07-04 LAB — POCT PREGNANCY, URINE: Preg Test, Ur: NEGATIVE

## 2018-07-04 MED ORDER — MEDROXYPROGESTERONE ACETATE 150 MG/ML IM SUSP
150.0000 mg | Freq: Once | INTRAMUSCULAR | Status: AC
  Administered 2018-07-04: 150 mg via INTRAMUSCULAR

## 2018-07-04 NOTE — Progress Notes (Signed)
History:  [name] is a 27 y.o. G1P0101 who presents to clinic today for Depo Provera. Patient has been on Depo Provera x 1 year since birth of last child. She continues to have irregular periods, but is happy overall with the Depo Provera and would like to continue. She denies any GYN complaints today.    The following portions of the patient's history were reviewed and updated as appropriate: allergies, current medications, family history, past medical history, social history, past surgical history and problem list.  Review of Systems:  Review of Systems  Constitutional: Negative for fever.  Gastrointestinal: Negative for abdominal pain.  Genitourinary:       Neg - vaginal bleeding      Objective:  Physical Exam BP 124/78   Pulse (!) 109   Ht 5\' 7"  (1.702 m)   Wt 141 lb 12.8 oz (64.3 kg)   BMI 22.21 kg/m  Physical Exam  Constitutional: She is oriented to person, place, and time. She appears well-developed and well-nourished. No distress.  HENT:  Head: Normocephalic.  Cardiovascular: Tachycardia present.  Pulmonary/Chest: Effort normal.  Abdominal: Soft.  Genitourinary:  Genitourinary Comments: Pap smear deferred as patient it is not medically indicated at this time  Neurological: She is alert and oriented to person, place, and time.  Skin: Skin is warm and dry. No erythema.  Psychiatric: She has a normal mood and affect. Her behavior is normal. Judgment and thought content normal.    Assessment & Plan:  1. Encounter for surveillance of injectable contraceptive - medroxyPROGESTERone ([name]) injection 150 mg - Patient advised to start OTC Calcium supplement 1000 mg daily  - Return in 12 weeks for next Depo Provera injection or sooner PRN  Sonya Green, Sonya Hissong N, PA-C 07/04/2018 2:52 PM

## 2018-07-04 NOTE — Patient Instructions (Signed)
Hormonal Contraception Information °Hormonal contraception is a type of birth control that uses hormones to prevent pregnancy. It usually involves a combination of the hormones estrogen and progesterone or only the hormone progesterone. Hormonal contraception works in these ways: °· It thickens the mucus in the cervix, making it harder for sperm to enter the uterus. °· It changes the lining of the uterus, making it harder for an egg to implant. °· It may stop the ovaries from releasing eggs (ovulation). Some women who take hormonal contraceptives that contain only progesterone may continue to ovulate. ° °Hormonal contraception cannot prevent sexually transmitted infections (STIs). Pregnancy may still occur. °Estrogen and progesterone contraceptives °Contraceptives that use a combination of estrogen and progesterone are available in these forms: °· Pill. Pills come in different combinations of hormones. They must be taken at the same time each day. Pills can affect your period, causing you to get your period once every three months or not at all. °· Patch. The patch must be worn on the lower abdomen for three weeks and then removed on the fourth. °· Vaginal ring. The ring is placed in the vagina and left there for three weeks. It is then removed for one week. ° °Progesterone contraceptives °Contraceptives that use progesterone only are available in these forms: °· Pill. Pills should be taken every day of the cycle. °· Intrauterine device (IUD). This device is inserted into the uterus and removed or replaced every five years or sooner. °· Implant. Plastic rods are placed under the skin of the upper arm. They are removed or replaced every three years or sooner. °· Injection. The injection is given once every 90 days. ° °What are the side effects? °The side effects of estrogen and progesterone contraceptives include: °· Nausea. °· Headaches. °· Breast tenderness. °· Bleeding or spotting between menstrual cycles. °· High  blood pressure (rare). °· Strokes, heart attacks, or blood clots (rare) ° °Side effects of progesterone-only contraceptives include: °· Nausea. °· Headaches. °· Breast tenderness. °· Unpredictable menstrual bleeding. °· High blood pressure (rare). ° °Talk to your health care provider about what side effects may affect you. °Where to find more information: °· Ask your health care provider for more information and resources about hormonal contraception. °· [place] Department of Health and Human Services Office on Women's Health: www.womenshealth.gov °Questions to ask: °· What type of hormonal contraception is right for me? °· How long should I plan to use hormonal contraception? °· What are the side effects of the hormonal contraception method I choose? °· How can I prevent STIs while using hormonal contraception? °Contact a health care provider if: °· You start taking hormonal contraceptives and you develop persistent or severe side effects. °Summary °· Estrogen and progesterone are hormones used in many forms of birth control. °· Talk to your health care provider about what side effects may affect you. °· Hormonal contraception cannot prevent sexually transmitted infections (STIs). °· Ask your health care provider for more information and resources about hormonal contraception. °This information is not intended to replace advice given to you by your health care provider. Make sure you discuss any questions you have with your health care provider. °Document Released: 11/14/2007 Document Revised: 09/24/2016 Document Reviewed: 09/24/2016 °[employer]vier Interactive Patient Education © 2018 [employer] ° °

## 2018-07-28 ENCOUNTER — Encounter: Payer: Self-pay | Admitting: *Deleted

## 2018-08-20 ENCOUNTER — Emergency Department (HOSPITAL_COMMUNITY): Payer: Managed Care, Other (non HMO)

## 2018-08-20 ENCOUNTER — Emergency Department (HOSPITAL_COMMUNITY)
Admission: EM | Admit: 2018-08-20 | Discharge: 2018-08-21 | Disposition: A | Discharge status: Home / Self Care | Payer: Managed Care, Other (non HMO) | Attending: Emergency Medicine | Admitting: Emergency Medicine

## 2018-08-20 ENCOUNTER — Encounter (HOSPITAL_COMMUNITY): Payer: Self-pay | Admitting: Emergency Medicine

## 2018-08-20 DIAGNOSIS — Z79899 Other long term (current) drug therapy: Secondary | ICD-10-CM | POA: Insufficient documentation

## 2018-08-20 DIAGNOSIS — R079 Chest pain, unspecified: Secondary | ICD-10-CM | POA: Diagnosis present

## 2018-08-20 DIAGNOSIS — R0789 Other chest pain: Secondary | ICD-10-CM | POA: Diagnosis not present

## 2018-08-20 LAB — I-STAT TROPONIN, ED
TROPONIN I, POC: 0 ng/mL (ref 0.00–0.08)
Troponin i, poc: 0 ng/mL (ref 0.00–0.08)

## 2018-08-20 LAB — BASIC METABOLIC PANEL
ANION GAP: 10 (ref 5–15)
BUN: 11 mg/dL (ref 6–20)
CO2: 23 mmol/L (ref 22–32)
Calcium: 9.1 mg/dL (ref 8.9–10.3)
Chloride: 107 mmol/L (ref 98–111)
Creatinine, Ser: 0.77 mg/dL (ref 0.44–1.00)
GFR calc Af Amer: >60 mL/min (ref >60)
GFR calc non Af Amer: >60 mL/min (ref >60)
GLUCOSE: 76 mg/dL (ref 70–99)
POTASSIUM: 3.4 mmol/L — AB (ref 3.5–5.1)
Sodium: 140 mmol/L (ref 135–145)

## 2018-08-20 LAB — CBC
HEMATOCRIT: 41.4 % (ref 36.0–46.0)
HEMOGLOBIN: 13.8 g/dL (ref 12.0–15.0)
MCH: 30.1 pg (ref 26.0–34.0)
MCHC: 33.3 g/dL (ref 30.0–36.0)
MCV: 90.2 fL (ref 80.0–100.0)
Platelets: 258 10*3/uL (ref 150–400)
RBC: 4.59 MIL/uL (ref 3.87–5.11)
RDW: 11.3 % — ABNORMAL LOW (ref 11.5–15.5)
WBC: 7.4 10*3/uL (ref 4.0–10.5)
nRBC: 0 % (ref 0.0–0.2)

## 2018-08-20 LAB — I-STAT BETA HCG BLOOD, ED (MC, WL, AP ONLY): I-stat hCG, quantitative: <5 m[IU]/mL (ref <5)

## 2018-08-20 MED ORDER — SODIUM CHLORIDE 0.9 % IV BOLUS
500.0000 mL | Freq: Once | INTRAVENOUS | Status: AC
  Administered 2018-08-20: 500 mL via INTRAVENOUS

## 2018-08-20 NOTE — ED Provider Notes (Signed)
[facility] EMERGENCY DEPARTMENT Provider Note   CSN: [id number] Arrival date & time: 08/20/18  2102     History   Chief Complaint Chief Complaint  Patient presents with  . Chest Pain    HPI Sonya Green is a 27 y.o. female.  The history is provided by the patient.  Palpitations   This is a new problem. The current episode started 6 to 12 hours ago. The problem occurs constantly. The problem has not changed since onset.Associated with: lifting a chair at work. Associated symptoms include chest pain and cough. Pertinent negatives include no diaphoresis, no fever, no malaise/fatigue, no numbness, no chest pressure, no claudication, no exertional chest pressure, no irregular heartbeat, no near-syncope, no orthopnea, no PND, no syncope, no abdominal pain, no headaches, no back pain, no leg pain, no lower extremity edema, no dizziness, no weakness, no hemoptysis and no shortness of breath. She has tried nothing for the symptoms. The treatment provided moderate relief. There are no known risk factors. Her past medical history does not include anemia, hyperthyroidism or valve disorder.    Past Medical History:  Diagnosis Date  . Jaundice of newborn   . Migraines     Patient Active Problem List   Diagnosis Date Noted  . Postpartum care and examination 08/24/2017  . Encounter for initial prescription of injectable contraceptive 08/24/2017  . Status post cesarean section 07/13/2017  . UTI in pregnancy 07/01/2017    Past Surgical History:  Procedure Laterality Date  . CESAREAN SECTION N/A 07/10/2017   Procedure: CESAREAN SECTION;  Surgeon: Tereso Newcomer, MD;  Location: [facility];  Service: Obstetrics;  Laterality: N/A;  . WISDOM TOOTH EXTRACTION       OB History    Gravida  1   Para  1   Term      Preterm  1   AB      Living  1     SAB      TAB      Ectopic      Multiple  0   Live Births  1            Home Medications      Prior to Admission medications   Medication Sig Start Date End Date Taking? Authorizing Provider  acetaminophen (TYLENOL) 500 MG tablet Take 1 tablet (500 mg total) by mouth every 6 (six) hours as needed. 06/26/18   Fawze, Mina A, PA-C  Aspirin-Acetaminophen-Caffeine (EXCEDRIN PO) Take 1 tablet by mouth every 6 (six) hours as needed (HEADACHE).    [provider]  ibuprofen (ADVIL,MOTRIN) 600 MG tablet Take 1 tablet (600 mg total) by mouth every 6 (six) hours as needed. Patient taking differently: Take 600 mg by mouth every 6 (six) hours as needed for headache.  06/26/18   Michela Pitcher A, PA-C  medroxyPROGESTERone ([name]) 150 MG/ML injection Inject 150 mg into the muscle every 3 (three) months.    [provider]    Family History Family History  Problem Relation Age of Onset  . Healthy Mother   . Arthritis Maternal Grandmother   . Stroke Maternal Grandmother   . Diabetes Maternal Grandmother   . Lung cancer Maternal Grandfather   . Diabetes Paternal Grandmother     Social History Social History   Tobacco Use  . Smoking status: Never Smoker  . Smokeless tobacco: Never Used  Substance Use Topics  . Alcohol use: No    Comment: Occasionally  . Drug use:  No     Allergies   Spinach; Sulfa antibiotics; and Zithromax [azithromycin]   Review of Systems Review of Systems  Constitutional: Negative for diaphoresis, fever and malaise/fatigue.  Respiratory: Positive for cough. Negative for hemoptysis and shortness of breath.   Cardiovascular: Positive for chest pain and palpitations. Negative for orthopnea, claudication, leg swelling, syncope, PND and near-syncope.  Gastrointestinal: Negative for abdominal pain.  Musculoskeletal: Negative for back pain.  Neurological: Negative for dizziness, weakness, numbness and headaches.  All other systems reviewed and are negative.    Physical Exam Updated Vital Signs BP 133/79 (BP Location: Left Arm)   Pulse 99    Temp 98.4 F (36.9 C) (Oral)   Resp 18   Ht 5\' 7"  (1.702 m)   Wt 64.3 kg   SpO2 100%   BMI 22.20 kg/m   Physical Exam  Constitutional: She is oriented to person, place, and time. She appears well-developed and well-nourished. No distress.  HENT:  Head: Normocephalic and atraumatic.  Nose: Nose normal.  Mouth/Throat: No oropharyngeal exudate.  Eyes: Pupils are equal, round, and reactive to light. Conjunctivae are normal.  Neck: Normal range of motion. Neck supple.  Cardiovascular: Normal rate, regular rhythm, normal heart sounds and intact distal pulses.  Pulmonary/Chest: Effort normal and breath sounds normal. No stridor. She has no wheezes. She has no rales.  Abdominal: Soft. Bowel sounds are normal. She exhibits no mass. There is no tenderness. There is no rebound and no guarding.  Musculoskeletal: Normal range of motion. She exhibits no edema or tenderness.  Neurological: She is alert and oriented to person, place, and time. She displays normal reflexes.  Skin: Skin is warm and dry. Capillary refill takes less than 2 seconds.  Psychiatric: Her mood appears anxious.     ED Treatments / Results  Labs (all labs ordered are listed, but only abnormal results are displayed) Results for orders placed or performed during the hospital encounter of 08/20/18  Basic metabolic panel  Result Value Ref Range   Sodium 140 135 - 145 mmol/L   Potassium 3.4 (L) 3.5 - 5.1 mmol/L   Chloride 107 98 - 111 mmol/L   CO2 23 22 - 32 mmol/L   Glucose, Bld 76 70 - 99 mg/dL   BUN 11 6 - 20 mg/dL   Creatinine, Ser 6.21 0.44 - 1.00 mg/dL   Calcium 9.1 8.9 - 30.8 mg/dL   GFR calc non Af Amer >60 >60 mL/min   GFR calc Af Amer >60 >60 mL/min   Anion gap 10 5 - 15  CBC  Result Value Ref Range   WBC 7.4 4.0 - 10.5 K/uL   RBC 4.59 3.87 - 5.11 MIL/uL   Hemoglobin 13.8 12.0 - 15.0 g/dL   HCT 65.7 84.6 - 96.2 %   MCV 90.2 80.0 - 100.0 fL   MCH 30.1 26.0 - 34.0 pg   MCHC 33.3 30.0 - 36.0 g/dL   RDW  95.2 (L) 84.1 - 15.5 %   Platelets 258 150 - 400 K/uL   nRBC 0.0 0.0 - 0.2 %  I-stat troponin, ED  Result Value Ref Range   Troponin i, poc 0.00 0.00 - 0.08 ng/mL   Comment 3          I-Stat beta hCG blood, ED  Result Value Ref Range   I-stat hCG, quantitative <5.0 <5 mIU/mL   Comment 3           Dg Chest 2 View  Result Date: 08/20/2018 CLINICAL DATA:  Left-sided chest pain starting at work after lifting a chair. Nonproductive cough for a few days. Nausea. EXAM: CHEST - 2 VIEW COMPARISON:  01/18/2018 FINDINGS: The heart size and mediastinal contours are within normal limits. Both lungs are clear. The visualized skeletal structures are unremarkable. IMPRESSION: No active cardiopulmonary disease. Electronically Signed   By: Burman Nieves M.D.   On: 08/20/2018 22:02   EKG  EKG Interpretation  Date/Time:    Ventricular Rate:    PR Interval:    QRS Duration:   QT Interval:    QTC Calculation:   R Axis:     Text Interpretation:         Radiology Dg Chest 2 View  Result Date: 08/20/2018 CLINICAL DATA:  Left-sided chest pain starting at work after lifting a chair. Nonproductive cough for a few days. Nausea. EXAM: CHEST - 2 VIEW COMPARISON:  01/18/2018 FINDINGS: The heart size and mediastinal contours are within normal limits. Both lungs are clear. The visualized skeletal structures are unremarkable. IMPRESSION: No active cardiopulmonary disease. Electronically Signed   By: Burman Nieves M.D.   On: 08/20/2018 22:02    Procedures Procedures (including critical care time)  Medications Ordered in ED Medications  sodium chloride 0.9 % bolus 500 mL (has no administration in time range)       Final Clinical Impressions(s) / ED Diagnoses   Return for weakness, numbness, changes in vision or speech, fevers >100.4 unrelieved by medication, shortness of breath, intractable vomiting, or diarrhea, abdominal pain, Inability to tolerate liquids or food, cough, altered mental  status or any concerns. No signs of systemic illness or infection. The patient is nontoxic-appearing on exam and vital signs are within normal limits.    I have reviewed the triage vital signs and the nursing notes. Pertinent labs &imaging results that were available during my care of the patient were reviewed by me and considered in my medical decision making (see chart for details).  After history, exam, and medical workup I feel the patient has been appropriately medically screened and is safe for discharge home. Pertinent diagnoses were discussed with the patient. Patient was given return precautions.   Lajuan Godbee, MD 08/21/18 [phone]

## 2018-08-20 NOTE — ED Triage Notes (Signed)
Brought by ems for c/o left sided chest pain that started at work after lifting a chair.  Also endorses having a non productive cough for a few days.  C/o nausea as well.  Given zofran 4mg  IV pta.

## 2018-08-21 LAB — D-DIMER, QUANTITATIVE: D-Dimer, Quant: <0.27 ug/mL-FEU (ref 0.00–0.50)

## 2018-08-21 MED ORDER — IBUPROFEN 800 MG PO TABS
800.0000 mg | ORAL_TABLET | Freq: Once | ORAL | Status: AC
  Administered 2018-08-21: 800 mg via ORAL
  Filled 2018-08-21: qty 1

## 2018-08-21 MED ORDER — GI COCKTAIL ~~LOC~~
30.0000 mL | Freq: Once | ORAL | Status: DC
  Filled 2018-08-21: qty 30

## 2018-08-21 MED ORDER — IBUPROFEN 800 MG PO TABS: 800.0000 mg | ORAL_TABLET | Freq: Three times a day (TID) | ORAL | 0 refills | Status: DC

## 2018-08-21 MED ORDER — ACETAMINOPHEN 500 MG PO TABS
1000.0000 mg | ORAL_TABLET | Freq: Once | ORAL | Status: AC
  Administered 2018-08-21: 1000 mg via ORAL
  Filled 2018-08-21: qty 2

## 2018-08-21 MED ORDER — GI COCKTAIL ~~LOC~~
30.0000 mL | Freq: Once | ORAL | Status: AC
  Administered 2018-08-21: 30 mL via ORAL

## 2018-08-21 NOTE — ED Notes (Signed)
Reviewed d/c instructions with pt, who verbalized understanding and had no outstanding questions. Pt departed in NAD.   
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
## 2018-09-26 ENCOUNTER — Ambulatory Visit: Payer: Managed Care, Other (non HMO)

## 2018-10-03 ENCOUNTER — Ambulatory Visit (INDEPENDENT_AMBULATORY_CARE_PROVIDER_SITE_OTHER): Payer: Managed Care, Other (non HMO) | Admitting: General Practice

## 2018-10-03 VITALS — BP 131/69 | HR 118 | Ht 67.0 in | Wt 134.0 lb

## 2018-10-03 DIAGNOSIS — Z3042 Encounter for surveillance of injectable contraceptive: Secondary | ICD-10-CM

## 2018-10-03 MED ORDER — MEDROXYPROGESTERONE ACETATE 150 MG/ML IM SUSP
150.0000 mg | INTRAMUSCULAR | Status: AC
  Administered 2018-10-03: 150 mg via INTRAMUSCULAR

## 2018-10-03 NOTE — Progress Notes (Signed)
Chart reviewed for nurse visit. Agree with plan of care.   Marylene LandKooistra, Damarri Rampy Lorraine, CNM 10/03/2018 3:47 PM

## 2018-10-03 NOTE — Progress Notes (Signed)
Sonya EndoAngelita Goldenstein here for [name]  Injection.  Injection administered without complication. Patient will return in 3 months for next injection.  Marylynn PearsonCarrie Sharrie Self, RN 10/03/2018  1:54 PM

## 2018-12-11 ENCOUNTER — Ambulatory Visit: Payer: Medicaid Other

## 2018-12-25 ENCOUNTER — Encounter (HOSPITAL_COMMUNITY): Payer: Self-pay

## 2018-12-25 ENCOUNTER — Emergency Department (HOSPITAL_COMMUNITY)
Admission: EM | Admit: 2018-12-25 | Discharge: 2018-12-25 | Disposition: A | Discharge status: Home / Self Care | Payer: BLUE CROSS/BLUE SHIELD | Attending: Emergency Medicine | Admitting: Emergency Medicine

## 2018-12-25 ENCOUNTER — Other Ambulatory Visit: Payer: Self-pay

## 2018-12-25 DIAGNOSIS — N3000 Acute cystitis without hematuria: Secondary | ICD-10-CM | POA: Diagnosis not present

## 2018-12-25 DIAGNOSIS — R1033 Periumbilical pain: Secondary | ICD-10-CM | POA: Diagnosis not present

## 2018-12-25 LAB — URINALYSIS, ROUTINE W REFLEX MICROSCOPIC
Bilirubin Urine: NEGATIVE
Glucose, UA: NEGATIVE mg/dL
HGB URINE DIPSTICK: NEGATIVE
Ketones, ur: NEGATIVE mg/dL
NITRITE: POSITIVE — AB
PH: 6 (ref 5.0–8.0)
Protein, ur: NEGATIVE mg/dL
SPECIFIC GRAVITY, URINE: 1.02 (ref 1.005–1.030)

## 2018-12-25 LAB — COMPREHENSIVE METABOLIC PANEL
ALK PHOS: 44 U/L (ref 38–126)
ALT: 13 U/L (ref 0–44)
AST: 15 U/L (ref 15–41)
Albumin: 4.2 g/dL (ref 3.5–5.0)
Anion gap: 9 (ref 5–15)
BILIRUBIN TOTAL: 0.9 mg/dL (ref 0.3–1.2)
BUN: 8 mg/dL (ref 6–20)
CALCIUM: 9.3 mg/dL (ref 8.9–10.3)
CHLORIDE: 108 mmol/L (ref 98–111)
CO2: 24 mmol/L (ref 22–32)
CREATININE: 0.8 mg/dL (ref 0.44–1.00)
GFR calc Af Amer: >60 mL/min (ref >60)
Glucose, Bld: 75 mg/dL (ref 70–99)
Potassium: 3.6 mmol/L (ref 3.5–5.1)
Sodium: 141 mmol/L (ref 135–145)
Total Protein: 7 g/dL (ref 6.5–8.1)

## 2018-12-25 LAB — CBC
HEMATOCRIT: 43.9 % (ref 36.0–46.0)
Hemoglobin: 14.5 g/dL (ref 12.0–15.0)
MCH: 30.1 pg (ref 26.0–34.0)
MCHC: 33 g/dL (ref 30.0–36.0)
MCV: 91.1 fL (ref 80.0–100.0)
PLATELETS: 230 10*3/uL (ref 150–400)
RBC: 4.82 MIL/uL (ref 3.87–5.11)
RDW: 11.2 % — AB (ref 11.5–15.5)
WBC: 3.2 10*3/uL — ABNORMAL LOW (ref 4.0–10.5)
nRBC: 0 % (ref 0.0–0.2)

## 2018-12-25 LAB — I-STAT BETA HCG BLOOD, ED (MC, WL, AP ONLY): I-stat hCG, quantitative: <5 m[IU]/mL (ref <5)

## 2018-12-25 LAB — LIPASE, BLOOD: LIPASE: 51 U/L (ref 11–51)

## 2018-12-25 MED ORDER — SODIUM CHLORIDE 0.9% FLUSH: 3.0000 mL | Freq: Once | INTRAVENOUS | Status: DC

## 2018-12-25 MED ORDER — CEPHALEXIN 500 MG PO CAPS: 500.0000 mg | ORAL_CAPSULE | Freq: Three times a day (TID) | ORAL | 0 refills | Status: AC

## 2018-12-25 NOTE — ED Notes (Signed)
Patient verbalizes understanding of discharge instructions. Opportunity for questioning and answers were provided. Armband removed by staff, pt discharged from ED ambulatory by self\  

## 2018-12-25 NOTE — ED Triage Notes (Signed)
Pt reports intermittent abdominal pain. Denies vaginal bleeding. Reports her period is irregular and she could be pregnant.

## 2018-12-25 NOTE — ED Provider Notes (Signed)
Emergency Department Provider Note   I have reviewed the triage vital signs and the nursing notes.   HISTORY  Chief Complaint Abdominal Pain   HPI Sonya Green is a 28 y.o. female with PMH of migraine HA presents to the emergency department for evaluation of intermittent, cramping, central abdominal pain which occurs intermittently over the past several weeks.  Patient has pain several times per week with no obvious provoking factors or modifying factors.  Patient states that the symptoms are near her umbilicus.  She is not experiencing vomiting or diarrhea.  No blood in the bowel movements.  No fevers or chills.  Symptoms not worse with eating.  She denies any radiation into the chest or shortness of breath.  No active pain at this time.  Denies any dysuria, hesitancy, urgency.  No vaginal bleeding or discharge.   Past Medical History:  Diagnosis Date  . Jaundice of newborn   . Migraines     Patient Active Problem List   Diagnosis Date Noted  . Postpartum care and examination 08/24/2017  . Encounter for initial prescription of injectable contraceptive 08/24/2017  . Status post cesarean section 07/13/2017  . UTI in pregnancy 07/01/2017    Past Surgical History:  Procedure Laterality Date  . CESAREAN SECTION N/A 07/10/2017   Procedure: CESAREAN SECTION;  Surgeon: Tereso Newcomer, MD;  Location: [facility];  Service: Obstetrics;  Laterality: N/A;  . WISDOM TOOTH EXTRACTION     Allergies Spinach; Sulfa antibiotics; and Zithromax [azithromycin]  Family History  Problem Relation Age of Onset  . Healthy Mother   . Arthritis Maternal Grandmother   . Stroke Maternal Grandmother   . Diabetes Maternal Grandmother   . Lung cancer Maternal Grandfather   . Diabetes Paternal Grandmother     Social History Social History   Tobacco Use  . Smoking status: Never Smoker  . Smokeless tobacco: Never Used  Substance Use Topics  . Alcohol use: No    Comment:  Occasionally  . Drug use: No    Review of Systems  Constitutional: No fever/chills. Occasional lightheadedness.  Eyes: No visual changes. ENT: No sore throat. Cardiovascular: Denies chest pain. Respiratory: Denies shortness of breath. Gastrointestinal: Positive intermittent periumbilical abdominal pain. Positive nausea, no vomiting.  No diarrhea.  No constipation. Genitourinary: Negative for dysuria. Musculoskeletal: Negative for back pain. Skin: Negative for rash. Neurological: Negative for headaches, focal weakness or numbness.  10-point ROS otherwise negative.  ____________________________________________   PHYSICAL EXAM:  VITAL SIGNS: ED Triage Vitals  Enc Vitals Group     BP 12/25/18 1250 (!) 147/80     Pulse Rate 12/25/18 1250 87     Resp 12/25/18 1250 16     Temp 12/25/18 1250 98.5 F (36.9 C)     Temp Source 12/25/18 1250 Oral     SpO2 12/25/18 1250 99 %     Weight 12/25/18 1251 135 lb (61.2 kg)     Height 12/25/18 1251 5\' 7"  (1.702 m)     Pain Score 12/25/18 1249 4   Constitutional: Alert and oriented. Well appearing and in no acute distress. Eyes: Conjunctivae are normal. Head: Atraumatic. Nose: No congestion/rhinnorhea. Mouth/Throat: Mucous membranes are moist.  Oropharynx non-erythematous. Neck: No stridor.  Cardiovascular: Normal rate, regular rhythm. Good peripheral circulation. Grossly normal heart sounds.   Respiratory: Normal respiratory effort.  No retractions. Lungs CTAB. Gastrointestinal: Soft and nontender. No distention.  Musculoskeletal: No lower extremity tenderness nor edema. No gross deformities of extremities. Neurologic:  Normal  speech and language. No gross focal neurologic deficits are appreciated.  Skin:  Skin is warm, dry and intact. No rash noted.  ____________________________________________   LABS (all labs ordered are listed, but only abnormal results are displayed)  Labs Reviewed  CBC - Abnormal; Notable for the following  components:      Result Value   WBC 3.2 (*)    RDW 11.2 (*)    All other components within normal limits  URINALYSIS, ROUTINE W REFLEX MICROSCOPIC - Abnormal; Notable for the following components:   APPearance HAZY (*)    Nitrite POSITIVE (*)    Leukocytes,Ua MODERATE (*)    Bacteria, UA MANY (*)    All other components within normal limits  LIPASE, BLOOD  COMPREHENSIVE METABOLIC PANEL  I-STAT BETA HCG BLOOD, ED ([facility], [facility], [facility] ONLY)   ____________________________________________  RADIOLOGY  None  ____________________________________________   PROCEDURES  Procedure(s) performed:   Procedures  None  ____________________________________________   INITIAL IMPRESSION / ASSESSMENT AND PLAN / ED COURSE  Pertinent labs & imaging results that were available during my care of the patient were reviewed by me and considered in my medical decision making (see chart for details).  Patient presents to the emergency department with intermittent periumbilical abdominal pain with occasional nausea.  Some mild lightheadedness here but standing without difficulty.  She is drinking fluids in the exam room prior to my evaluation without nausea vomiting or worsening pain.  Her abdomen is diffusely soft and nontender to palpation.  She has prior right upper quadrant ultrasound from 2019 on my chart review which showed gallbladder sludge but no other acute findings.  Patient has a negative Murphy's.  Urinalysis is pending.  Labs reviewed which showed no acute findings.  No additional imaging at this time.  Advise close PCP follow-up and gastroenterology referral as needed if symptoms and other conservative management do not improve.   ____________________________________________  FINAL CLINICAL IMPRESSION(S) / ED DIAGNOSES  Final diagnoses:  Periumbilical abdominal pain  Acute cystitis without hematuria    NEW OUTPATIENT MEDICATIONS STARTED DURING THIS VISIT:  Discharge Medication List as of  12/25/2018  4:18 PM    START taking these medications   Details  cephALEXin (KEFLEX) 500 MG capsule Take 1 capsule (500 mg total) by mouth 3 (three) times daily for 7 days., Starting Mon 12/25/2018, Until Mon 01/01/2019, Print        Note:  This document was prepared using Dragon voice recognition software and may include unintentional dictation errors.  Alona Bene, MD Emergency Medicine    Long, Arlyss Repress, MD 12/26/18 [phone]

## 2018-12-25 NOTE — Discharge Instructions (Signed)

## 2019-01-17 ENCOUNTER — Ambulatory Visit (INDEPENDENT_AMBULATORY_CARE_PROVIDER_SITE_OTHER): Payer: BLUE CROSS/BLUE SHIELD

## 2019-01-17 VITALS — BP 121/74 | HR 89 | Wt 139.0 lb

## 2019-01-17 DIAGNOSIS — Z3202 Encounter for pregnancy test, result negative: Secondary | ICD-10-CM

## 2019-01-17 DIAGNOSIS — Z3042 Encounter for surveillance of injectable contraceptive: Secondary | ICD-10-CM

## 2019-01-17 LAB — POCT PREGNANCY, URINE: Preg Test, Ur: NEGATIVE

## 2019-01-17 NOTE — Progress Notes (Signed)
Pt here today for Depo Provera.  Pt is two weeks late since last injection and reports having intercourse with no protection.  Per protocol pt advised to come back in two weeks either abstain from intercourse or use condoms.  She will come in two weeks for another pregnancy test and if that is negative we will administer her Depo.  Pt stated understanding with no further questions.

## 2019-01-17 NOTE — Progress Notes (Signed)
I have reviewed this chart and agree with the RN/CMA assessment and management.    K. Meryl Jodi Kappes, M.D. Attending [facility] (Faculty Practice)   

## 2019-01-31 ENCOUNTER — Other Ambulatory Visit: Payer: Self-pay

## 2019-01-31 ENCOUNTER — Ambulatory Visit (INDEPENDENT_AMBULATORY_CARE_PROVIDER_SITE_OTHER): Payer: BLUE CROSS/BLUE SHIELD

## 2019-01-31 ENCOUNTER — Ambulatory Visit: Payer: Managed Care, Other (non HMO)

## 2019-01-31 ENCOUNTER — Telehealth: Payer: Self-pay | Admitting: Family Medicine

## 2019-01-31 DIAGNOSIS — Z3042 Encounter for surveillance of injectable contraceptive: Secondary | ICD-10-CM | POA: Diagnosis not present

## 2019-01-31 LAB — POCT PREGNANCY, URINE: PREG TEST UR: NEGATIVE

## 2019-01-31 MED ORDER — MEDROXYPROGESTERONE ACETATE 150 MG/ML IM SUSP
150.0000 mg | Freq: Once | INTRAMUSCULAR | Status: AC
  Administered 2019-01-31: 150 mg via INTRAMUSCULAR

## 2019-01-31 NOTE — Telephone Encounter (Signed)
Called patient to inform her of her appointment being canceled, and to call the office for more information about her appointment.

## 2019-01-31 NOTE — Progress Notes (Signed)
Patient seen and assessed by nursing staff.  Agree with documentation and plan.  

## 2019-01-31 NOTE — Progress Notes (Signed)
Sonya Green here for [name]  Injection.  Injection administered without complication. Patient will return in 3 months for next injection.  Ralene Bathe, RN 01/31/2019  2:25 PM

## 2019-02-20 ENCOUNTER — Ambulatory Visit: Payer: Self-pay | Admitting: *Deleted

## 2019-02-20 NOTE — Telephone Encounter (Signed)
Pt reports palpitations since October. Is not having presently. States when palpitations occur also experiences "Black dots in my eyes and dizziness." States episode of CP yesterday, brief, only episode. Also states SOB when "Heart beats fast." States all these symptoms have not occurred in months. Called because she is seeing "The black spots floating." Denies headache. States may be stress related. Also occurs after "Eating a lot of sugar."  Pt has not been seen at practice since 2016. Not accepting pts presently, pt made aware, verbalizes understanding. Advised to go to UC/ED when palpitations do present for thorough eval. States will do so.   Reason for Disposition . Palpitations are a chronic symptom (recurrent or ongoing AND present > 4 weeks)  Answer Assessment - Initial Assessment Questions 1. DESCRIPTION: "Please describe your heart rate or heart beat that you are having" (e.g., fast/slow, regular/irregular, skipped or extra beats, "palpitations")    Palpitations last 3-5 hours 2. ONSET: "When did it start?" (Minutes, hours or days)      In October 3. DURATION: "How long does it last" (e.g., seconds, minutes, hours)     3-5 hours 4. PATTERN "Does it come and go, or has it been constant since it started?"  "Does it get worse with exertion?"   "Are you feeling it now?"    Intermittent After eating or stressed 5. TAP: "Using your hand, can you tap out what you are feeling on a chair or table in nt of you, so that I can hear?" (Note: not all patients can do this)       No Not having presently 6. HEART RATE: "Can you tell me your heart rate?" "How many beats in 15 seconds?"  (Note: not all patients can do this)      no 7. RECURRENT SYMPTOM: "Have you ever had this before?" If so, ask: "When was the last time?" and "What happened that time?"      Off and on since October 8. CAUSE: "What do you think is causing the palpitations?"     Not sure 9. CARDIAC HISTORY: "Do you have any history of  heart disease?" (e.g., heart attack, angina, bypass surgery, angioplasty, arrhythmia)      no 10. OTHER SYMPTOMS: "Do you have any other symptoms?" (e.g., dizziness, chest pain, sweating, difficulty breathing)      Lightheaded and SOB, see black spots both eyes, CP yesterday only, left sided 11. PREGNANCY: "Is there any chance you are pregnant?" "When was your last menstrual period?"      On BC, irregular Last menses november  Protocols used: HEART RATE AND HEARTBEAT QUESTIONS-A-AH

## 2019-04-18 ENCOUNTER — Ambulatory Visit (INDEPENDENT_AMBULATORY_CARE_PROVIDER_SITE_OTHER): Payer: Medicaid Other

## 2019-04-18 ENCOUNTER — Other Ambulatory Visit: Payer: Self-pay

## 2019-04-18 ENCOUNTER — Telehealth: Payer: Self-pay | Admitting: Family Medicine

## 2019-04-18 DIAGNOSIS — Z3042 Encounter for surveillance of injectable contraceptive: Secondary | ICD-10-CM

## 2019-04-18 MED ORDER — MEDROXYPROGESTERONE ACETATE 150 MG/ML IM SUSP
150.0000 mg | Freq: Once | INTRAMUSCULAR | Status: AC
  Administered 2019-04-18: 150 mg via INTRAMUSCULAR

## 2019-04-18 NOTE — Telephone Encounter (Signed)
Spoke with patient in regards to her appointment on 6/10 @ 2:00. Patient instructed to wear face mask for the entire appointment and no visitors will be allowed with her to the appointment. Patient was screened for the covid symptoms and denied having any symptoms.

## 2019-04-18 NOTE — Progress Notes (Signed)
Sonya Green here for [name]  Injection.  Injection administered without complication. Patient will return in 3 months for next injection.  [name], RN 04/18/2019  2:59 PM

## 2019-04-23 NOTE — Progress Notes (Signed)
I have reviewed this chart and agree with the RN/CMA assessment and management.    Fontaine Hehl C Fransisca Shawn, MD, FACOG Attending Physician, Faculty Practice [facility]  

## 2019-07-05 ENCOUNTER — Other Ambulatory Visit: Payer: Self-pay

## 2019-07-05 ENCOUNTER — Ambulatory Visit (INDEPENDENT_AMBULATORY_CARE_PROVIDER_SITE_OTHER): Payer: Medicaid Other | Admitting: Lactation Services

## 2019-07-05 DIAGNOSIS — Z3042 Encounter for surveillance of injectable contraceptive: Secondary | ICD-10-CM | POA: Diagnosis not present

## 2019-07-05 MED ORDER — MEDROXYPROGESTERONE ACETATE 150 MG/ML IM SUSP
150.0000 mg | Freq: Once | INTRAMUSCULAR | Status: AC
  Administered 2019-07-05: 14:00:00 150 mg via INTRAMUSCULAR

## 2019-07-05 NOTE — Progress Notes (Signed)
Sonya Green here for [name]  Injection.  Injection administered without complication. Patient will return in 3 months  for next injection. Pt tolerated well. Pt reports she has no questions or concerns at this time.   Donn Pierini, RN 07/05/2019  1:41 PM

## 2019-08-22 ENCOUNTER — Emergency Department (HOSPITAL_COMMUNITY)
Admission: EM | Admit: 2019-08-22 | Discharge: 2019-08-22 | Disposition: A | Discharge status: Home / Self Care | Payer: Medicaid Other | Attending: Emergency Medicine | Admitting: Emergency Medicine

## 2019-08-22 ENCOUNTER — Encounter (HOSPITAL_COMMUNITY): Payer: Self-pay | Admitting: Emergency Medicine

## 2019-08-22 ENCOUNTER — Other Ambulatory Visit: Payer: Self-pay

## 2019-08-22 DIAGNOSIS — R42 Dizziness and giddiness: Secondary | ICD-10-CM | POA: Diagnosis not present

## 2019-08-22 DIAGNOSIS — N390 Urinary tract infection, site not specified: Secondary | ICD-10-CM | POA: Diagnosis not present

## 2019-08-22 DIAGNOSIS — Z7982 Long term (current) use of aspirin: Secondary | ICD-10-CM | POA: Insufficient documentation

## 2019-08-22 DIAGNOSIS — Z79899 Other long term (current) drug therapy: Secondary | ICD-10-CM | POA: Insufficient documentation

## 2019-08-22 DIAGNOSIS — R109 Unspecified abdominal pain: Secondary | ICD-10-CM | POA: Diagnosis present

## 2019-08-22 LAB — COMPREHENSIVE METABOLIC PANEL
ALT: 11 U/L (ref 0–44)
AST: 13 U/L — ABNORMAL LOW (ref 15–41)
Albumin: 4.3 g/dL (ref 3.5–5.0)
Alkaline Phosphatase: 44 U/L (ref 38–126)
Anion gap: 7 (ref 5–15)
BUN: 11 mg/dL (ref 6–20)
CO2: 24 mmol/L (ref 22–32)
Calcium: 9 mg/dL (ref 8.9–10.3)
Chloride: 106 mmol/L (ref 98–111)
Creatinine, Ser: 0.8 mg/dL (ref 0.44–1.00)
GFR calc Af Amer: >60 mL/min (ref >60)
GFR calc non Af Amer: >60 mL/min (ref >60)
Glucose, Bld: 66 mg/dL — ABNORMAL LOW (ref 70–99)
Potassium: 3.6 mmol/L (ref 3.5–5.1)
Sodium: 137 mmol/L (ref 135–145)
Total Bilirubin: 1 mg/dL (ref 0.3–1.2)
Total Protein: 6.6 g/dL (ref 6.5–8.1)

## 2019-08-22 LAB — URINALYSIS, ROUTINE W REFLEX MICROSCOPIC
Bilirubin Urine: NEGATIVE
Glucose, UA: NEGATIVE mg/dL
Hgb urine dipstick: NEGATIVE
Ketones, ur: NEGATIVE mg/dL
Nitrite: NEGATIVE
Protein, ur: NEGATIVE mg/dL
Specific Gravity, Urine: 1.024 (ref 1.005–1.030)
pH: 6 (ref 5.0–8.0)

## 2019-08-22 LAB — CBC
HCT: 43.2 % (ref 36.0–46.0)
Hemoglobin: 14.9 g/dL (ref 12.0–15.0)
MCH: 31.3 pg (ref 26.0–34.0)
MCHC: 34.5 g/dL (ref 30.0–36.0)
MCV: 90.8 fL (ref 80.0–100.0)
Platelets: 233 10*3/uL (ref 150–400)
RBC: 4.76 MIL/uL (ref 3.87–5.11)
RDW: 11.2 % — ABNORMAL LOW (ref 11.5–15.5)
WBC: 3.2 10*3/uL — ABNORMAL LOW (ref 4.0–10.5)
nRBC: 0 % (ref 0.0–0.2)

## 2019-08-22 LAB — I-STAT BETA HCG BLOOD, ED (MC, WL, AP ONLY): I-stat hCG, quantitative: <5 m[IU]/mL (ref <5)

## 2019-08-22 LAB — LIPASE, BLOOD: Lipase: 55 U/L — ABNORMAL HIGH (ref 11–51)

## 2019-08-22 MED ORDER — SODIUM CHLORIDE 0.9% FLUSH: 3.0000 mL | Freq: Once | INTRAVENOUS | Status: DC

## 2019-08-22 MED ORDER — CEPHALEXIN 500 MG PO CAPS: 500.0000 mg | ORAL_CAPSULE | Freq: Four times a day (QID) | ORAL | 0 refills | Status: DC

## 2019-08-22 NOTE — ED Notes (Signed)
AVS provided, discharge instruction provided on decreasing risk for recurrent UTIs.  No further questions.  Patient ambulated without difficulty.  Instructed to pick up her prescription and follow instructions

## 2019-08-22 NOTE — Discharge Instructions (Signed)
Return if any problems.

## 2019-08-22 NOTE — ED Notes (Addendum)
Patient reports  Nausea is going away, she is just feeling weak and a little dizzy today.  Noticed glucose was low.  Patient reports she has eaten a muffin and chips while she was in lobby with no nausea. Denies urinary symptoms

## 2019-08-22 NOTE — ED Triage Notes (Signed)
Pt states she as bit by a mosquito last night and soon after began having sharp abd pain, nausea and vomiting, endorses chills, denies fever or any known sick contacts.

## 2019-09-11 NOTE — ED Provider Notes (Signed)
[facility] EMERGENCY DEPARTMENT Provider Note   CSN: [id number] Arrival date & time: 08/22/19  1050     History   Chief Complaint Chief Complaint  Patient presents with  . Abdominal Pain  . Nausea    HPI Angelisa Yetman is a 28 y.o. female.     The history is provided by the patient. No language interpreter was used.  Abdominal Pain Pain location:  Suprapubic Pain severity:  Moderate Timing:  Constant Progression:  Worsening Chronicity:  New Worsened by:  Nothing Ineffective treatments:  None tried Associated symptoms: nausea and vomiting   Pt complains of nausea and vomiting.  Pt had an insect bite and concerned this is from bite.   Past Medical History:  Diagnosis Date  . Jaundice of newborn   . Migraines     Patient Active Problem List   Diagnosis Date Noted  . Postpartum care and examination 08/24/2017  . Encounter for initial prescription of injectable contraceptive 08/24/2017  . Status post cesarean section 07/13/2017  . UTI in pregnancy 07/01/2017    Past Surgical History:  Procedure Laterality Date  . CESAREAN SECTION N/A 07/10/2017   Procedure: CESAREAN SECTION;  Surgeon: Tereso Newcomer, MD;  Location: [facility];  Service: Obstetrics;  Laterality: N/A;  . WISDOM TOOTH EXTRACTION       OB History    Gravida  1   Para  1   Term      Preterm  1   AB      Living  1     SAB      TAB      Ectopic      Multiple  0   Live Births  1            Home Medications    Prior to Admission medications   Medication Sig Start Date End Date Taking? Authorizing Provider  acetaminophen (TYLENOL) 500 MG tablet Take 1 tablet (500 mg total) by mouth every 6 (six) hours as needed. Patient taking differently: Take 500 mg by mouth every 6 (six) hours as needed for mild pain.  06/26/18   Fawze, Mina A, PA-C  Aspirin-Acetaminophen-Caffeine (EXCEDRIN PO) Take 1 tablet by mouth every 6 (six) hours as needed (HEADACHE).     [provider]  cephALEXin (KEFLEX) 500 MG capsule Take 1 capsule (500 mg total) by mouth 4 (four) times daily. 08/22/19   Elson Areas, PA-C  hydroxypropyl methylcellulose / hypromellose (ISOPTO TEARS / GONIOVISC) 2.5 % ophthalmic solution Place 1 drop into both eyes 3 (three) times daily as needed for dry eyes.    [provider]  ibuprofen (ADVIL,MOTRIN) 600 MG tablet Take 1 tablet (600 mg total) by mouth every 6 (six) hours as needed. Patient taking differently: Take 600 mg by mouth every 6 (six) hours as needed for headache.  06/26/18   Michela Pitcher A, PA-C  ibuprofen (ADVIL,MOTRIN) 800 MG tablet Take 1 tablet (800 mg total) by mouth 3 (three) times daily. 08/21/18   Palumbo, April, MD  medroxyPROGESTERone ([name]) 150 MG/ML injection Inject 150 mg into the muscle every 3 (three) months.    [provider]  Menthol, Topical Analgesic, (ICY HOT EX) Apply 1 application topically daily as needed (pain).    [provider]  Multiple Vitamin (MULTIVITAMIN WITH MINERALS) TABS tablet Take 1 tablet by mouth daily.    [provider]    Family History Family History  Problem Relation Age of Onset  .  Healthy Mother   . Arthritis Maternal Grandmother   . Stroke Maternal Grandmother   . Diabetes Maternal Grandmother   . Lung cancer Maternal Grandfather   . Diabetes Paternal Grandmother     Social History Social History   Tobacco Use  . Smoking status: Never Smoker  . Smokeless tobacco: Never Used  Substance Use Topics  . Alcohol use: No    Comment: Occasionally  . Drug use: No     Allergies   Spinach, Sulfa antibiotics, and Zithromax [azithromycin]   Review of Systems Review of Systems  Gastrointestinal: Positive for abdominal pain, nausea and vomiting.  All other systems reviewed and are negative.    Physical Exam Updated Vital Signs BP 124/70   Pulse 79   Temp 98.5 F (36.9 C) (Oral)   Resp 13   SpO2 99%    Breastfeeding No   Physical Exam Vitals signs and nursing note reviewed.  Constitutional:      Appearance: She is well-developed.  HENT:     Head: Normocephalic.  Neck:     Musculoskeletal: Normal range of motion.  Cardiovascular:     Rate and Rhythm: Normal rate.  Pulmonary:     Effort: Pulmonary effort is normal.  Abdominal:     General: Abdomen is flat. There is no distension.     Palpations: Abdomen is soft.     Tenderness: There is no abdominal tenderness.  Musculoskeletal: Normal range of motion.  Skin:    General: Skin is warm.  Neurological:     General: No focal deficit present.     Mental Status: She is alert and oriented to person, place, and time.  Psychiatric:        Mood and Affect: Mood normal.      ED Treatments / Results  Labs (all labs ordered are listed, but only abnormal results are displayed) Labs Reviewed  LIPASE, BLOOD - Abnormal; Notable for the following components:      Result Value   Lipase 55 (*)    All other components within normal limits  COMPREHENSIVE METABOLIC PANEL - Abnormal; Notable for the following components:   Glucose, Bld 66 (*)    AST 13 (*)    All other components within normal limits  CBC - Abnormal; Notable for the following components:   WBC 3.2 (*)    RDW 11.2 (*)    All other components within normal limits  URINALYSIS, ROUTINE W REFLEX MICROSCOPIC - Abnormal; Notable for the following components:   APPearance HAZY (*)    Leukocytes,Ua LARGE (*)    Bacteria, UA MANY (*)    All other components within normal limits  I-STAT BETA HCG BLOOD, ED ([facility], [facility], [facility] ONLY)    EKG None  Radiology No results found.  Procedures Procedures (including critical care time)  Medications Ordered in ED Medications - No data to display   Initial Impression / Assessment and Plan / ED Course  I have reviewed the triage vital signs and the nursing notes.  Pertinent labs & imaging results that were available during my care of the  patient were reviewed by me and considered in my medical decision making (see chart for details).          Final Clinical Impressions(s) / ED Diagnoses   Final diagnoses:  Urinary tract infection without hematuria, site unspecified    ED Discharge Orders         Ordered    cephALEXin (KEFLEX) 500 MG capsule  4 times  daily     08/22/19 1304        MDM  Ua shows infection.  Pt given rx for keflex.  Pt advised to follow up with her MD for recheck  An After Visit Summary was printed and given to the patient.    Elson AreasSofia, Markelle Asaro K, [place] JerseyPA-C 09/11/19 1418    Maia PlanLong, Joshua G, MD 09/11/19 [phone]

## 2019-09-24 ENCOUNTER — Telehealth: Payer: Self-pay | Admitting: Family Medicine

## 2019-09-24 NOTE — Telephone Encounter (Signed)
Spoke to patient about her appointment on 11/17 @ 10:00. Patient instructed to wear a face mask for the entire appointment and no visitors are allowed with her during the visit. Patient screened for covid symptoms and denied having any

## 2019-09-25 ENCOUNTER — Ambulatory Visit: Payer: Medicaid Other

## 2019-09-27 ENCOUNTER — Other Ambulatory Visit: Payer: Self-pay

## 2019-09-27 ENCOUNTER — Ambulatory Visit (INDEPENDENT_AMBULATORY_CARE_PROVIDER_SITE_OTHER): Payer: Medicaid Other | Admitting: General Practice

## 2019-09-27 VITALS — BP 116/77 | HR 82 | Ht 67.0 in | Wt 143.0 lb

## 2019-09-27 DIAGNOSIS — Z30013 Encounter for initial prescription of injectable contraceptive: Secondary | ICD-10-CM | POA: Diagnosis not present

## 2019-09-27 MED ORDER — MEDROXYPROGESTERONE ACETATE 150 MG/ML IM SUSP
150.0000 mg | Freq: Once | INTRAMUSCULAR | Status: AC
  Administered 2019-09-27: 11:00:00 150 mg via INTRAMUSCULAR

## 2019-09-27 NOTE — Progress Notes (Signed)
I agree with the plan of care and documentation.  Noni Saupe I, NP 09/27/2019 1:22 PM

## 2019-09-27 NOTE — Progress Notes (Signed)
Sonya Green here for [name]  Injection.  Injection administered without complication. Patient will return in 3 months for next injection.  Derinda Late, RN 09/27/2019  10:26 AM

## 2019-10-23 ENCOUNTER — Ambulatory Visit: Payer: Medicaid Other | Admitting: Family Medicine

## 2019-11-06 ENCOUNTER — Ambulatory Visit: Payer: Medicaid Other | Admitting: Nurse Practitioner

## 2019-12-13 ENCOUNTER — Ambulatory Visit: Payer: Medicaid Other

## 2019-12-26 ENCOUNTER — Ambulatory Visit (INDEPENDENT_AMBULATORY_CARE_PROVIDER_SITE_OTHER): Payer: 59

## 2019-12-26 ENCOUNTER — Other Ambulatory Visit: Payer: Self-pay

## 2019-12-26 VITALS — BP 104/71 | HR 91 | Wt 143.1 lb

## 2019-12-26 DIAGNOSIS — Z3042 Encounter for surveillance of injectable contraceptive: Secondary | ICD-10-CM

## 2019-12-26 MED ORDER — MEDROXYPROGESTERONE ACETATE 150 MG/ML IM SUSP
150.0000 mg | Freq: Once | INTRAMUSCULAR | Status: AC
  Administered 2019-12-26: 150 mg via INTRAMUSCULAR

## 2019-12-26 NOTE — Progress Notes (Signed)
Patient seen and assessed by nursing staff during this encounter. I have reviewed the chart and agree with the documentation and plan.  Minas Bonser, MD 12/26/2019 3:04 PM    

## 2019-12-26 NOTE — Progress Notes (Signed)
[name] here for [name]  Injection.  Injection administered without complication. Patient will return in 3 months for next injection.  Marjo Bicker, RN 12/26/2019  9:33 AM

## 2020-01-02 ENCOUNTER — Encounter: Payer: Self-pay | Admitting: Medical

## 2020-01-12 ENCOUNTER — Encounter: Payer: Self-pay | Admitting: Medical

## 2020-01-30 ENCOUNTER — Ambulatory Visit: Payer: Medicaid Other | Admitting: Medical

## 2020-02-12 ENCOUNTER — Other Ambulatory Visit (HOSPITAL_COMMUNITY)
Admission: RE | Admit: 2020-02-12 | Discharge: 2020-02-12 | Disposition: A | Discharge status: Home / Self Care | Payer: 59 | Source: Ambulatory Visit | Attending: Medical | Admitting: Medical

## 2020-02-12 ENCOUNTER — Encounter: Payer: Self-pay | Admitting: Advanced Practice Midwife

## 2020-02-12 ENCOUNTER — Other Ambulatory Visit: Payer: Self-pay

## 2020-02-12 ENCOUNTER — Ambulatory Visit (INDEPENDENT_AMBULATORY_CARE_PROVIDER_SITE_OTHER): Payer: 59 | Admitting: Advanced Practice Midwife

## 2020-02-12 VITALS — Ht 67.0 in | Wt 147.8 lb

## 2020-02-12 DIAGNOSIS — Z113 Encounter for screening for infections with a predominantly sexual mode of transmission: Secondary | ICD-10-CM | POA: Insufficient documentation

## 2020-02-12 DIAGNOSIS — Z01419 Encounter for gynecological examination (general) (routine) without abnormal findings: Secondary | ICD-10-CM

## 2020-02-12 NOTE — Progress Notes (Signed)
GYNECOLOGY ANNUAL PREVENTATIVE CARE ENCOUNTER NOTE  Subjective:   Sonya Green is a 29 y.o. G51P0101 female here for a routine annual gynecologic exam.  Current complaints: none.   Denies abnormal vaginal bleeding, discharge, pelvic pain, problems with intercourse or other gynecologic concerns. Patient would like STD testing today as well. She is on Depo for birth control. She is happy with this method at this time, and would like to continue. Next injection due 03/12/2020   Gynecologic History Patient's last menstrual period was 12/17/2019 (approximate). Contraception: [name] injections Last Pap: 2018. Results were: normal Last mammogram: NA, age. Results were: NA age   Obstetric History OB History  Gravida Para Term Preterm AB Living  1 1   1   1   SAB TAB Ectopic Multiple Live Births        0 1    # Outcome Date GA Lbr Len/2nd Weight Sex Delivery Anes PTL Lv  1 Preterm 07/10/17 [redacted]w[redacted]d  5 lb 3.6 oz (2.37 kg) M CS-LTranv EPI  LIV    Past Medical History:  Diagnosis Date  . Jaundice of newborn   . Migraines     Past Surgical History:  Procedure Laterality Date  . CESAREAN SECTION N/A 07/10/2017   Procedure: CESAREAN SECTION;  Surgeon: Osborne Oman, MD;  Location: [facility];  Service: Obstetrics;  Laterality: N/A;  . WISDOM TOOTH EXTRACTION      Current Outpatient Medications on File Prior to Visit  Medication Sig Dispense Refill  . Aspirin-Acetaminophen-Caffeine (EXCEDRIN PO) Take 1 tablet by mouth every 6 (six) hours as needed (HEADACHE).    . medroxyPROGESTERone ([name]) 150 MG/ML injection Inject 150 mg into the muscle every 3 (three) months.    . Menthol, Topical Analgesic, (ICY HOT EX) Apply 1 application topically daily as needed (pain).    . Multiple Vitamin (MULTIVITAMIN WITH MINERALS) TABS tablet Take 1 tablet by mouth daily.    Marland Kitchen acetaminophen (TYLENOL) 500 MG tablet Take 1 tablet (500 mg total) by mouth every 6 (six) hours as needed. (Patient  taking differently: Take 500 mg by mouth every 6 (six) hours as needed for mild pain. ) 30 tablet 0  . ibuprofen (ADVIL,MOTRIN) 600 MG tablet Take 1 tablet (600 mg total) by mouth every 6 (six) hours as needed. (Patient taking differently: Take 600 mg by mouth every 6 (six) hours as needed for headache. ) 30 tablet 0   No current facility-administered medications on file prior to visit.    Allergies  Allergen Reactions  . Spinach Anaphylaxis    Throat closes up  . Sulfa Antibiotics Rash    Has patient had a PCN reaction causing immediate rash, facial/tongue/throat swelling, SOB or lightheadedness with hypotension: No Has patient had a PCN reaction causing severe rash involving mucus membranes or skin necrosis: Yes Has patient had a PCN reaction that required hospitalization: Unknown Has patient had a PCN reaction occurring within the last 10 years: No If all of the above answers are "NO", then may proceed with Cephalosporin use.   Marland Kitchen Zithromax [Azithromycin] Rash    Social History   Socioeconomic History  . Marital status: Single    Spouse name: Not on file  . Number of children: Not on file  . Years of education: 38  . Highest education level: Not on file  Occupational History  . Occupation: Housekeeping  Tobacco Use  . Smoking status: Never Smoker  . Smokeless tobacco: Never Used  Substance and Sexual Activity  . Alcohol use:  No    Comment: Occasionally  . Drug use: No  . Sexual activity: Not Currently    Birth control/protection: None  Other Topics Concern  . Not on file  Social History Narrative   Fun: Sports   Denies religious beliefs effecting health care.   Denies abuse and feels safe at home.    Social Determinants of Health   Financial Resource Strain:   . Difficulty of Paying Living Expenses:   Food Insecurity:   . Worried About Programme researcher, broadcasting/film/video in the Last Year:   . Barista in the Last Year:   Transportation Needs:   . Freight forwarder  (Medical):   Marland Kitchen Lack of Transportation (Non-Medical):   Physical Activity:   . Days of Exercise per Week:   . Minutes of Exercise per Session:   Stress:   . Feeling of Stress :   Social Connections:   . Frequency of Communication with Friends and Family:   . Frequency of Social Gatherings with Friends and Family:   . Attends Religious Services:   . Active Member of Clubs or Organizations:   . Attends Banker Meetings:   Marland Kitchen Marital Status:   Intimate Partner Violence:   . Fear of Current or Ex-Partner:   . Emotionally Abused:   Marland Kitchen Physically Abused:   . Sexually Abused:     Family History  Problem Relation Age of Onset  . Healthy Mother   . Arthritis Maternal Grandmother   . Stroke Maternal Grandmother   . Diabetes Maternal Grandmother   . Lung cancer Maternal Grandfather   . Diabetes Paternal Grandmother     The following portions of the patient's history were reviewed and updated as appropriate: allergies, current medications, past family history, past medical history, past social history, past surgical history and problem list.  Review of Systems Pertinent items noted in HPI and remainder of comprehensive ROS otherwise negative.   Objective:  Ht 5\' 7"  (1.702 m)   Wt 147 lb 12.8 oz (67 kg)   LMP 12/17/2019 (Approximate)   BMI 23.15 kg/m  CONSTITUTIONAL: Well-developed, well-nourished female in no acute distress.  HENT:  Normocephalic, atraumatic, External right and left ear normal. Oropharynx is clear and moist EYES: Conjunctivae and EOM are normal. Pupils are equal, round, and reactive to light. No scleral icterus.  NECK: Normal range of motion, supple, no masses.  Normal thyroid.  SKIN: Skin is warm and dry. No rash noted. Not diaphoretic. No erythema. No pallor. NEUROLOGIC: Alert and oriented to person, place, and time. Normal reflexes, muscle tone coordination. No cranial nerve deficit noted. PSYCHIATRIC: Normal mood and affect. Normal behavior. Normal  judgment and thought content. CARDIOVASCULAR: Normal heart rate noted, regular rhythm RESPIRATORY: Clear to auscultation bilaterally. Effort and breath sounds normal, no problems with respiration noted. BREASTS: Symmetric in size. No masses, skin changes, nipple drainage, or lymphadenopathy. ABDOMEN: Soft, normal bowel sounds, no distention noted.  No tenderness, rebound or guarding.  PELVIC: Normal appearing external genitalia; normal appearing vaginal mucosa and cervix.  No abnormal discharge noted.  Pap smear obtained.  Normal uterine size, no other palpable masses, no uterine or adnexal tenderness. MUSCULOSKELETAL: Normal range of motion. No tenderness.  No cyanosis, clubbing, or edema.  2+ distal pulses.   Assessment and Plan:  1. Women's annual routine gynecological examination - Cytology - PAP( [place])  2. Screening for STD (sexually transmitted disease) - Cervicovaginal ancillary only( [place]) - HIV Antibody (routine testing w  rflx) - RPR - Hepatitis C Antibody - Hepatitis B Surface AntiGEN  Will follow up results of pap smear and manage accordingly. Will follow up STI screening results as needed  Routine preventative health maintenance measures emphasized. Please refer to After Visit Summary for other counseling recommendations.    Thressa Sheller DNP, CNM  02/12/20  2:20 PM

## 2020-02-13 LAB — CERVICOVAGINAL ANCILLARY ONLY
Bacterial Vaginitis (gardnerella): NEGATIVE
Candida Glabrata: NEGATIVE
Candida Vaginitis: NEGATIVE
Comment: NEGATIVE
Comment: NEGATIVE
Comment: NEGATIVE
Comment: NEGATIVE
Trichomonas: NEGATIVE

## 2020-02-13 LAB — HIV ANTIBODY (ROUTINE TESTING W REFLEX): HIV Screen 4th Generation wRfx: NONREACTIVE

## 2020-02-13 LAB — HEPATITIS B SURFACE ANTIGEN: Hepatitis B Surface Ag: NEGATIVE

## 2020-02-13 LAB — HEPATITIS C ANTIBODY: Hep C Virus Ab: <0.1 s/co ratio (ref 0.0–0.9)

## 2020-02-13 LAB — RPR: RPR Ser Ql: NONREACTIVE

## 2020-02-14 LAB — CYTOLOGY - PAP
Chlamydia: NEGATIVE
Comment: NEGATIVE
Comment: NORMAL
Neisseria Gonorrhea: NEGATIVE

## 2020-02-21 ENCOUNTER — Telehealth: Payer: Self-pay | Admitting: *Deleted

## 2020-02-21 NOTE — Telephone Encounter (Addendum)
-----   Message from Armando Reichert, CNM sent at 02/20/2020  8:29 AM EDT ----- Pap smear is abnormal and according to the ASCCP guidelines she needs a colpo. Can you call her and inform her and get it scheduled. Thanks.   4/15  1420 Called pt and informed her of abnormal Pap results. Colposcopy procedure briefly explained. She will be scheduled for appointment and will receive a MyChart notification. Pt voiced understanding of all information given,

## 2020-03-12 ENCOUNTER — Ambulatory Visit: Payer: Medicaid Other

## 2020-03-12 ENCOUNTER — Encounter: Payer: Self-pay | Admitting: Advanced Practice Midwife

## 2020-03-19 ENCOUNTER — Encounter: Payer: Self-pay | Admitting: Advanced Practice Midwife

## 2020-03-21 ENCOUNTER — Ambulatory Visit: Payer: Medicaid Other

## 2020-03-25 ENCOUNTER — Ambulatory Visit: Payer: 59

## 2020-04-01 ENCOUNTER — Ambulatory Visit: Payer: 59

## 2020-04-08 ENCOUNTER — Telehealth: Payer: Self-pay | Admitting: Family Medicine

## 2020-04-08 ENCOUNTER — Ambulatory Visit: Payer: 59 | Admitting: Family Medicine

## 2020-04-08 NOTE — Telephone Encounter (Signed)
Attempted to reach patient about her missed appointment. I called her, and left a voice message for her to call us back.

## 2020-04-09 ENCOUNTER — Ambulatory Visit: Payer: 59

## 2020-04-21 ENCOUNTER — Other Ambulatory Visit: Payer: Self-pay

## 2020-04-21 ENCOUNTER — Ambulatory Visit (INDEPENDENT_AMBULATORY_CARE_PROVIDER_SITE_OTHER): Payer: 59 | Admitting: *Deleted

## 2020-04-21 DIAGNOSIS — Z3042 Encounter for surveillance of injectable contraceptive: Secondary | ICD-10-CM

## 2020-04-21 NOTE — Progress Notes (Signed)
Pt is greater than 3 weeks late for Depo Provera injection. She reports last unprotected sex one week ago. Per standing orders pt was advised that we cannot give injection today due to possibility of pregnancy. Pt instructed to abstain from sex for 2 weeks and return for pregnancy test. If negative, she will receive Depo that Lindsie Simar. Pt voiced understanding and had no questions.

## 2020-05-05 ENCOUNTER — Other Ambulatory Visit: Payer: Self-pay

## 2020-05-05 ENCOUNTER — Ambulatory Visit (INDEPENDENT_AMBULATORY_CARE_PROVIDER_SITE_OTHER): Payer: Medicaid Other | Admitting: *Deleted

## 2020-05-05 ENCOUNTER — Encounter: Payer: Self-pay | Admitting: *Deleted

## 2020-05-05 VITALS — BP 117/77 | HR 86 | Ht 67.0 in | Wt 147.4 lb

## 2020-05-05 DIAGNOSIS — Z3042 Encounter for surveillance of injectable contraceptive: Secondary | ICD-10-CM | POA: Diagnosis not present

## 2020-05-05 DIAGNOSIS — Z3202 Encounter for pregnancy test, result negative: Secondary | ICD-10-CM

## 2020-05-05 LAB — POCT PREGNANCY, URINE: Preg Test, Ur: NEGATIVE

## 2020-05-05 MED ORDER — MEDROXYPROGESTERONE ACETATE 150 MG/ML IM SUSP
150.0000 mg | Freq: Once | INTRAMUSCULAR | Status: AC
  Administered 2020-05-05: 150 mg via INTRAMUSCULAR

## 2020-05-05 NOTE — Progress Notes (Signed)
Pt returns as scheduled for UPT which is negative. She reports having one episode of protected sex one week ago. Depo Provera 150 mg IM administered. Next dose due 9/13-9/27.

## 2020-05-07 NOTE — Progress Notes (Signed)
Chart reviewed for nurse visit. Agree with plan of care.   Venia Carbon I, NP 05/07/2020 7:29 AM .

## 2020-05-22 ENCOUNTER — Encounter: Payer: Self-pay | Admitting: Advanced Practice Midwife

## 2020-05-22 ENCOUNTER — Ambulatory Visit: Payer: 59 | Admitting: Obstetrics and Gynecology

## 2020-06-09 ENCOUNTER — Ambulatory Visit: Payer: Medicaid Other | Admitting: Obstetrics & Gynecology

## 2020-07-21 ENCOUNTER — Ambulatory Visit: Payer: Medicaid Other

## 2020-07-21 ENCOUNTER — Ambulatory Visit: Payer: Medicaid Other | Admitting: Obstetrics and Gynecology

## 2020-08-13 ENCOUNTER — Encounter: Payer: Self-pay | Admitting: Obstetrics and Gynecology

## 2020-08-13 ENCOUNTER — Other Ambulatory Visit: Payer: Self-pay

## 2020-08-13 ENCOUNTER — Ambulatory Visit (INDEPENDENT_AMBULATORY_CARE_PROVIDER_SITE_OTHER): Payer: Medicaid Other | Admitting: Obstetrics and Gynecology

## 2020-08-13 ENCOUNTER — Other Ambulatory Visit (HOSPITAL_COMMUNITY)
Admission: RE | Admit: 2020-08-13 | Discharge: 2020-08-13 | Disposition: A | Discharge status: Home / Self Care | Payer: Medicaid Other | Source: Ambulatory Visit | Attending: Obstetrics and Gynecology | Admitting: Obstetrics and Gynecology

## 2020-08-13 VITALS — BP 116/79 | HR 79 | Ht 67.0 in | Wt 153.0 lb

## 2020-08-13 DIAGNOSIS — Z3042 Encounter for surveillance of injectable contraceptive: Secondary | ICD-10-CM | POA: Diagnosis not present

## 2020-08-13 DIAGNOSIS — R87612 Low grade squamous intraepithelial lesion on cytologic smear of cervix (LGSIL): Secondary | ICD-10-CM | POA: Diagnosis not present

## 2020-08-13 HISTORY — DX: Low grade squamous intraepithelial lesion on cytologic smear of cervix (LGSIL): R87.612

## 2020-08-13 LAB — POCT PREGNANCY, URINE: Preg Test, Ur: NEGATIVE

## 2020-08-13 MED ORDER — MEDROXYPROGESTERONE ACETATE 150 MG/ML IM SUSP
150.0000 mg | Freq: Once | INTRAMUSCULAR | Status: AC
  Administered 2020-08-13: 150 mg via INTRAMUSCULAR

## 2020-08-13 NOTE — Addendum Note (Signed)
Addended by: Maxwell Marion E on: 08/13/2020 05:05 PM   Modules accepted: Orders

## 2020-08-13 NOTE — Progress Notes (Signed)
    GYNECOLOGY CLINIC COLPOSCOPY PROCEDURE NOTE  29 y.o. G1P0101 here for colposcopy for low-grade squamous intraepithelial neoplasia (LGSIL - encompassing HPV,mild dysplasia,CIN I) pap smear on 02/2020. Discussed role for HPV in cervical dysplasia, need for surveillance.  Patient given informed consent, signed copy in the chart, time out was performed.  Placed in lithotomy position. Cervix viewed with speculum and colposcope after application of acetic acid.   Colposcopy adequate? Yes  acetowhite lesion(s) noted at 6 & 12 o'clock; corresponding biopsies obtained.  ECC specimen obtained. All specimens were labelled and sent to pathology.   Patient was given post procedure instructions.  Will follow up pathology and manage accordingly.  Routine preventative health maintenance measures emphasized.    Nettie Elm, MD, FACOG Attending Obstetrician & Gynecologist Center for [facility], [facility]

## 2020-08-13 NOTE — Patient Instructions (Signed)
Colposcopy, Care After This sheet gives you information about how to care for yourself after your procedure. Your doctor may also give you more specific instructions. If you have problems or questions, contact your doctor. What can I expect after the procedure? If you did not have a tissue sample removed (did not have a biopsy), you may only have some spotting for a few days. You can go back to your normal activities. If you had a tissue sample removed, it is common to have:  Soreness and pain. This may last for a few days.  Light-headedness.  Mild bleeding from your vagina or dark-colored, grainy discharge from your vagina. This may last for a few days. You may need to wear a sanitary pad.  Spotting for at least 48 hours after the procedure. Follow these instructions at home:   Take over-the-counter and prescription medicines only as told by your doctor. Ask your doctor what medicines you can start taking again. This is very important if you take blood-thinning medicine.  Do not drive or use heavy machinery while taking prescription pain medicine.  For 3 days, or as long as your doctor tells you, avoid: ? Douching. ? Using tampons. ? Having sex.  If you use birth control (contraception), keep using it.  Limit activity for the first day after the procedure. Ask your doctor what activities are safe for you.  It is up to you to get the results of your procedure. Ask your doctor when your results will be ready.  Keep all follow-up visits as told by your doctor. This is important. Contact a doctor if:  You get a skin rash. Get help right away if:  You are bleeding a lot from your vagina. It is a lot of bleeding if you are using more than one pad an hour for 2 hours in a row.  You have clumps of blood (blood clots) coming from your vagina.  You have a fever.  You have chills  You have pain in your lower belly (pelvic area).  You have signs of infection, such as vaginal  discharge that is: ? Different than usual. ? Yellow. ? Bad-smelling.  You have very pain or cramps in your lower belly that do not get better with medicine.  You feel light-headed.  You feel dizzy.  You pass out (faint). Summary  If you did not have a tissue sample removed (did not have a biopsy), you may only have some spotting for a few days. You can go back to your normal activities.  If you had a tissue sample removed, it is common to have mild pain and spotting for 48 hours.  For 3 days, or as long as your doctor tells you, avoid douching, using tampons and having sex.  Get help right away if you have bleeding, very bad pain, or signs of infection. This information is not intended to replace advice given to you by your health care provider. Make sure you discuss any questions you have with your health care provider. Document Revised: 10/07/2017 Document Reviewed: 07/14/2016 Elsevier Patient Education  2020 Elsevier Inc.  

## 2020-08-15 LAB — SURGICAL PATHOLOGY

## 2020-08-25 ENCOUNTER — Encounter: Payer: Self-pay | Admitting: *Deleted

## 2020-08-25 ENCOUNTER — Other Ambulatory Visit: Payer: Self-pay

## 2020-08-25 ENCOUNTER — Ambulatory Visit (INDEPENDENT_AMBULATORY_CARE_PROVIDER_SITE_OTHER): Payer: Medicaid Other | Admitting: *Deleted

## 2020-08-25 DIAGNOSIS — R1030 Lower abdominal pain, unspecified: Secondary | ICD-10-CM | POA: Diagnosis not present

## 2020-08-25 NOTE — Progress Notes (Signed)
Pt submitted urine specimen earlier today to check for possible UTI. Results showed small Leukocytes and trace blood. Urine culture ordered. I called pt to discuss results and she did not answer. Voicemail message was left stating that I would send a MyChart message regarding her results as well as some questions to be answered.

## 2020-08-26 LAB — POCT URINALYSIS DIP (DEVICE)
Bilirubin Urine: NEGATIVE
Glucose, UA: NEGATIVE mg/dL
Ketones, ur: NEGATIVE mg/dL
Nitrite: NEGATIVE
Protein, ur: NEGATIVE mg/dL
Specific Gravity, Urine: 1.025 (ref 1.005–1.030)
Urobilinogen, UA: 0.2 mg/dL (ref 0.0–1.0)
pH: 6 (ref 5.0–8.0)

## 2020-08-27 NOTE — Progress Notes (Signed)
I have reviewed this chart and agree with the RN/CMA assessment and management.    K. Meryl Shayne Deerman, M.D. Attending [facility])   

## 2020-08-28 ENCOUNTER — Encounter: Payer: Self-pay | Admitting: *Deleted

## 2020-08-28 LAB — URINE CULTURE

## 2020-10-04 DIAGNOSIS — H5213 Myopia, bilateral: Secondary | ICD-10-CM | POA: Diagnosis not present

## 2020-10-29 ENCOUNTER — Ambulatory Visit: Payer: Medicaid Other

## 2020-11-04 ENCOUNTER — Other Ambulatory Visit: Payer: Self-pay

## 2020-11-04 ENCOUNTER — Ambulatory Visit (INDEPENDENT_AMBULATORY_CARE_PROVIDER_SITE_OTHER): Payer: Medicaid Other

## 2020-11-04 VITALS — BP 127/86 | HR 98

## 2020-11-04 DIAGNOSIS — Z3042 Encounter for surveillance of injectable contraceptive: Secondary | ICD-10-CM | POA: Diagnosis not present

## 2020-11-04 MED ORDER — MEDROXYPROGESTERONE ACETATE 150 MG/ML IM SUSP
150.0000 mg | Freq: Once | INTRAMUSCULAR | Status: AC
  Administered 2020-11-04: 150 mg via INTRAMUSCULAR

## 2020-11-04 NOTE — Progress Notes (Signed)
Sonya Green here for [name] Injection. Injection administered without complication. Patient will return in 3 months for next injection between March 15 and March 29. Next annual visit due 02/2021 .   Pt asked if she could switch to pills because she just wanted to try something different.  She felt that her mood has been off and wanted to switch.  Pt reports that she has no concern with bleeding on depo.  I advised pt to think about switching because she would need to remember to take it at the same time everyday and the change could also effect her cycle.  I encouraged pt to schedule an appt with a provider to discuss potentially switching methods and that she can also discuss her vaginal pain that she also reports.  I provided pt with information about different birth control methods so that she has more knowledge and maybe arise questions to bring to the appt.  Pt verbalized understanding with no further questions.   Ralene Bathe, RN 11/04/2020  1:59 PM

## 2020-11-19 ENCOUNTER — Ambulatory Visit: Payer: Medicaid Other | Admitting: Advanced Practice Midwife

## 2020-12-18 ENCOUNTER — Ambulatory Visit: Payer: Medicaid Other | Admitting: Obstetrics and Gynecology

## 2020-12-22 ENCOUNTER — Ambulatory Visit: Payer: Medicaid Other | Admitting: Obstetrics and Gynecology

## 2021-01-04 DIAGNOSIS — Z20822 Contact with and (suspected) exposure to covid-19: Secondary | ICD-10-CM | POA: Diagnosis not present

## 2021-01-04 DIAGNOSIS — J209 Acute bronchitis, unspecified: Secondary | ICD-10-CM | POA: Diagnosis not present

## 2021-01-12 ENCOUNTER — Other Ambulatory Visit: Payer: Self-pay

## 2021-01-12 ENCOUNTER — Ambulatory Visit (INDEPENDENT_AMBULATORY_CARE_PROVIDER_SITE_OTHER): Payer: Medicaid Other | Admitting: Nurse Practitioner

## 2021-01-12 ENCOUNTER — Encounter: Payer: Self-pay | Admitting: Nurse Practitioner

## 2021-01-12 ENCOUNTER — Other Ambulatory Visit (HOSPITAL_COMMUNITY)
Admission: RE | Admit: 2021-01-12 | Discharge: 2021-01-12 | Disposition: A | Discharge status: Home / Self Care | Payer: Medicaid Other | Source: Ambulatory Visit | Attending: Obstetrics and Gynecology | Admitting: Obstetrics and Gynecology

## 2021-01-12 VITALS — BP 119/65 | HR 91 | Ht 67.0 in | Wt 154.5 lb

## 2021-01-12 DIAGNOSIS — Z8659 Personal history of other mental and behavioral disorders: Secondary | ICD-10-CM | POA: Diagnosis not present

## 2021-01-12 DIAGNOSIS — R102 Pelvic and perineal pain: Secondary | ICD-10-CM | POA: Insufficient documentation

## 2021-01-12 DIAGNOSIS — R87612 Low grade squamous intraepithelial lesion on cytologic smear of cervix (LGSIL): Secondary | ICD-10-CM | POA: Diagnosis not present

## 2021-01-12 DIAGNOSIS — Z91419 Personal history of unspecified adult abuse: Secondary | ICD-10-CM | POA: Diagnosis not present

## 2021-01-12 DIAGNOSIS — F419 Anxiety disorder, unspecified: Secondary | ICD-10-CM | POA: Diagnosis not present

## 2021-01-12 DIAGNOSIS — Z8759 Personal history of other complications of pregnancy, childbirth and the puerperium: Secondary | ICD-10-CM

## 2021-01-12 NOTE — Progress Notes (Signed)
Pt states has had this Vagina pain off & on for the past year, feels like a stabbing, pinching, twisting pain. Really feels it when standing for 3 to 4 hrs.
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
## 2021-01-13 LAB — CERVICOVAGINAL ANCILLARY ONLY
Bacterial Vaginitis (gardnerella): NEGATIVE
Candida Glabrata: NEGATIVE
Candida Vaginitis: NEGATIVE
Chlamydia: POSITIVE — AB
Comment: NEGATIVE
Comment: NEGATIVE
Comment: NEGATIVE
Comment: NEGATIVE
Comment: NEGATIVE
Comment: NORMAL
Neisseria Gonorrhea: NEGATIVE
Trichomonas: NEGATIVE

## 2021-01-13 MED ORDER — DOXYCYCLINE MONOHYDRATE 100 MG PO CAPS: 100.0000 mg | ORAL_CAPSULE | Freq: Two times a day (BID) | ORAL | 0 refills | Status: DC

## 2021-01-13 NOTE — Addendum Note (Signed)
Addended by: Currie Paris on: 01/13/2021 05:36 PM   Modules accepted: Orders

## 2021-01-14 ENCOUNTER — Telehealth: Payer: Self-pay | Admitting: Lactation Services

## 2021-01-14 NOTE — Telephone Encounter (Signed)
Called patient to give results of most recent vaginal swab. Attempted x 3 and message received that phone was not in service. Patient was sent a My Chart message with her results also. She has not read them. Will try again at a later time and then send letter if needed.

## 2021-01-14 NOTE — Telephone Encounter (Signed)
STD report faxed to [facility].

## 2021-01-19 NOTE — BH Specialist Note (Signed)
Pt did not arrive to video visit and "no service" on pt's number [phone]; Called pt's mother's number, [phone], and Left HIPPA-compliant message to call back Asher Muir from [facility] [employer] at [facility] at [phone] [facility] office).  ; left MyChart message for patient.

## 2021-01-21 NOTE — Telephone Encounter (Signed)
Attempted to call patient again. Message received that phone no longer in service.   Called mother's number listed in the chart and LM for patient to call the office.   Will send letter.

## 2021-01-26 ENCOUNTER — Other Ambulatory Visit: Payer: Self-pay

## 2021-01-26 ENCOUNTER — Ambulatory Visit (INDEPENDENT_AMBULATORY_CARE_PROVIDER_SITE_OTHER): Payer: Medicaid Other | Admitting: *Deleted

## 2021-01-26 ENCOUNTER — Encounter: Payer: Self-pay | Admitting: *Deleted

## 2021-01-26 VITALS — BP 130/83 | HR 84 | Ht 67.0 in | Wt 152.2 lb

## 2021-01-26 DIAGNOSIS — Z3042 Encounter for surveillance of injectable contraceptive: Secondary | ICD-10-CM

## 2021-01-26 MED ORDER — MEDROXYPROGESTERONE ACETATE 150 MG/ML IM SUSP
150.0000 mg | Freq: Once | INTRAMUSCULAR | Status: AC
  Administered 2021-01-26: 150 mg via INTRAMUSCULAR

## 2021-01-26 NOTE — Progress Notes (Addendum)
Depo Provera 150 IM administered today @ 1417. Pt tolerated well. Next injection due 6/6-6/20. Last Pap 02/12/20 - LSIL, Colpo done 08/13/20. Pt is scheduled for Annual Gyn exam on 02/16/21.   Chart reviewed for nurse visit. Agree with plan of care.   Currie Paris, NP 01/26/2021 9:46 PM

## 2021-02-02 ENCOUNTER — Ambulatory Visit: Payer: Self-pay | Admitting: Clinical

## 2021-02-02 DIAGNOSIS — Z5329 Procedure and treatment not carried out because of patient's decision for other reasons: Secondary | ICD-10-CM

## 2021-02-02 DIAGNOSIS — Z91199 Patient's noncompliance with other medical treatment and regimen due to unspecified reason: Secondary | ICD-10-CM

## 2021-02-09 ENCOUNTER — Ambulatory Visit: Payer: Medicaid Other | Admitting: Nurse Practitioner

## 2021-02-16 ENCOUNTER — Ambulatory Visit: Payer: Medicaid Other | Admitting: Obstetrics & Gynecology

## 2021-04-13 ENCOUNTER — Ambulatory Visit (INDEPENDENT_AMBULATORY_CARE_PROVIDER_SITE_OTHER): Payer: Medicaid Other

## 2021-04-13 ENCOUNTER — Other Ambulatory Visit: Payer: Self-pay

## 2021-04-13 VITALS — BP 117/87 | HR 85 | Ht 67.0 in | Wt 154.7 lb

## 2021-04-13 DIAGNOSIS — Z3042 Encounter for surveillance of injectable contraceptive: Secondary | ICD-10-CM

## 2021-04-13 MED ORDER — MEDROXYPROGESTERONE ACETATE 150 MG/ML IM SUSP
150.0000 mg | Freq: Once | INTRAMUSCULAR | Status: AC
  Administered 2021-04-13: 150 mg via INTRAMUSCULAR

## 2021-04-13 NOTE — Progress Notes (Signed)
[name] here for [name] Injection. Injection administered without complication. Patient will return in 3 months for next injection between August 22 and Sept 5th, 2022. Next annual visit due with next Depo injection.   Isabell Jarvis, RN 04/13/2021  1:31 PM

## 2021-04-13 NOTE — Progress Notes (Signed)
Chart reviewed for nurse visit. Agree with plan of care.   Breunna Nordmann N, PA-C 04/13/2021 2:00 PM   

## 2021-04-21 ENCOUNTER — Encounter: Payer: Self-pay | Admitting: *Deleted

## 2021-04-21 ENCOUNTER — Ambulatory Visit (INDEPENDENT_AMBULATORY_CARE_PROVIDER_SITE_OTHER): Payer: Medicaid Other | Admitting: *Deleted

## 2021-04-21 DIAGNOSIS — R1032 Left lower quadrant pain: Secondary | ICD-10-CM | POA: Diagnosis not present

## 2021-04-21 DIAGNOSIS — Z3200 Encounter for pregnancy test, result unknown: Secondary | ICD-10-CM

## 2021-04-21 DIAGNOSIS — R350 Frequency of micturition: Secondary | ICD-10-CM | POA: Diagnosis not present

## 2021-04-21 DIAGNOSIS — R110 Nausea: Secondary | ICD-10-CM | POA: Diagnosis not present

## 2021-04-21 DIAGNOSIS — Z3202 Encounter for pregnancy test, result negative: Secondary | ICD-10-CM | POA: Diagnosis not present

## 2021-04-21 LAB — POCT PREGNANCY, URINE: Preg Test, Ur: NEGATIVE

## 2021-04-21 NOTE — Progress Notes (Signed)
I called patient again and again heard message " this number is not in service" . Will send MyChart message with result of negative pregnancy test. Montie Gelardi,RN

## 2021-04-21 NOTE — Progress Notes (Signed)
Patient dropped off urine for pregnancy test which was negative. I called patient at her phone number she left to notify and heard message this  number. Is not in service.  I called three times and heard same message. Kymberlie Brazeau,RN

## 2021-04-27 ENCOUNTER — Ambulatory Visit: Payer: Medicaid Other

## 2021-05-04 ENCOUNTER — Encounter: Payer: Self-pay | Admitting: Medical

## 2021-05-04 ENCOUNTER — Other Ambulatory Visit (HOSPITAL_COMMUNITY)
Admission: RE | Admit: 2021-05-04 | Discharge: 2021-05-04 | Disposition: A | Discharge status: Home / Self Care | Payer: Medicaid Other | Source: Ambulatory Visit | Attending: Obstetrics and Gynecology | Admitting: Obstetrics and Gynecology

## 2021-05-04 ENCOUNTER — Other Ambulatory Visit: Payer: Self-pay

## 2021-05-04 ENCOUNTER — Ambulatory Visit (INDEPENDENT_AMBULATORY_CARE_PROVIDER_SITE_OTHER): Payer: Medicaid Other | Admitting: Medical

## 2021-05-04 VITALS — BP 121/66 | HR 88 | Ht 67.0 in | Wt 159.4 lb

## 2021-05-04 DIAGNOSIS — R87612 Low grade squamous intraepithelial lesion on cytologic smear of cervix (LGSIL): Secondary | ICD-10-CM | POA: Diagnosis not present

## 2021-05-04 DIAGNOSIS — Z01419 Encounter for gynecological examination (general) (routine) without abnormal findings: Secondary | ICD-10-CM

## 2021-05-04 DIAGNOSIS — R102 Pelvic and perineal pain: Secondary | ICD-10-CM

## 2021-05-04 DIAGNOSIS — Z8619 Personal history of other infectious and parasitic diseases: Secondary | ICD-10-CM | POA: Diagnosis not present

## 2021-05-04 NOTE — Progress Notes (Signed)
U/S sched on 05/18/21 @ 1:30 at [facility].

## 2021-05-04 NOTE — Progress Notes (Signed)
History:  [name] is a 30 y.o. G1P0101 who presents to clinic today for annual exam and pap smear. 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 Recommendation was for repeat pap smear in 1 year. Pap smear will be obtained today. Patient is sexually active with 1 female partner. She denies bleeding, abnormal discharge, breast issues today. She has had intermittent pelvic pain x 1-2 months and was told by Urgent Care she may have an ovarian cyst. She also tested positive for Chlamydia in March 2022. She is on Depo for birth control and has intermittent spotting, but no periods.   The following portions of the patient's history were reviewed and updated as appropriate: allergies, current medications, family history, past medical history, social history, past surgical history and problem list.  Review of Systems:  Review of Systems  Constitutional:  Negative for chills and fever.  Gastrointestinal:  Positive for abdominal pain.  Genitourinary:  Negative for dysuria, frequency and urgency.       Neg - vaginal bleeding, discharge     Objective:  Physical Exam BP 121/66   Pulse 88   Ht 5\' 7"  (1.702 m)   Wt 159 lb 6.4 oz (72.3 kg)   LMP  (LMP Unknown)   BMI 24.97 kg/m  Physical Exam Vitals and nursing note reviewed. Exam conducted with a chaperone present.  Constitutional:      General: She is not in acute distress.    Appearance: Normal appearance. She is well-developed and normal weight.  HENT:     Head: Normocephalic and atraumatic.  Neck:     Thyroid: No thyroid mass or thyromegaly.  Cardiovascular:     Rate and Rhythm: Normal rate and regular rhythm.     Heart sounds: No murmur heard. Pulmonary:     Effort: Pulmonary effort is normal. No respiratory distress.     Breath sounds: Normal breath sounds. No wheezing.  Abdominal:     General: There is no distension.     Palpations: Abdomen is soft. There is no mass.     Tenderness: There is no abdominal  tenderness. There is no guarding or rebound.  Genitourinary:    General: Normal vulva.     Labia:        Right: No rash or lesion.        Left: No rash or lesion.      Vagina: Vaginal discharge (scant, white) present. No bleeding.     Cervix: Cervical bleeding (scant following pap) present. No cervical motion tenderness, discharge, friability or lesion.     Uterus: Normal. Not enlarged and not tender.      Adnexa: Right adnexa normal and left adnexa normal.       Right: No mass or tenderness.         Left: No mass or tenderness.       Comments: Breast exam deferred, no complaints Skin:    General: Skin is warm and dry.     Findings: No erythema.  Neurological:     Mental Status: She is alert and oriented to person, place, and time.    Health Maintenance Due  Topic Date Due   COVID-19 Vaccine (1) Never done     Assessment & Plan:  1. Women's annual routine gynecological examination - Cytology - PAP( [place]) - Hepatitis B surface antigen - Hepatitis C antibody - HIV Antibody (routine testing w rflx) - RPR  2. LGSIL on Pap smear of cervix -  Cytology - PAP( [place])  3. History of chlamydia - Cytology - PAP( [place])  4. Pelvic pain - US PELVIC COMPLETE WITH TRANSVAGINAL; Future - Will follow-up based on [place] results - Patient advised to take NSAIDs for pain in the meantime   Approximately 15 minutes of total time was spent with this patient on history taking, coordination of care, physical exam, patient education and documentation.   Marny Lowenstein, PA-C 05/04/2021 3:26 PM

## 2021-05-05 LAB — HIV ANTIBODY (ROUTINE TESTING W REFLEX): HIV Screen 4th Generation wRfx: NONREACTIVE

## 2021-05-05 LAB — HEPATITIS B SURFACE ANTIGEN: Hepatitis B Surface Ag: NEGATIVE

## 2021-05-05 LAB — HEPATITIS C ANTIBODY: Hep C Virus Ab: <0.1 s/co ratio (ref 0.0–0.9)

## 2021-05-05 LAB — RPR: RPR Ser Ql: NONREACTIVE

## 2021-05-06 LAB — CYTOLOGY - PAP
Chlamydia: NEGATIVE
Comment: NEGATIVE
Comment: NORMAL
Diagnosis: NEGATIVE
Diagnosis: REACTIVE
Neisseria Gonorrhea: NEGATIVE

## 2021-05-14 ENCOUNTER — Ambulatory Visit: Payer: Medicaid Other | Attending: Nurse Practitioner | Admitting: Physical Therapy

## 2021-05-14 ENCOUNTER — Other Ambulatory Visit: Payer: Self-pay

## 2021-05-14 ENCOUNTER — Encounter: Payer: Self-pay | Admitting: Physical Therapy

## 2021-05-14 DIAGNOSIS — M6281 Muscle weakness (generalized): Secondary | ICD-10-CM | POA: Diagnosis not present

## 2021-05-14 DIAGNOSIS — R293 Abnormal posture: Secondary | ICD-10-CM | POA: Diagnosis not present

## 2021-05-14 DIAGNOSIS — R109 Unspecified abdominal pain: Secondary | ICD-10-CM | POA: Insufficient documentation

## 2021-05-14 NOTE — Therapy (Addendum)
[facility] [address] [place], [place], [address] Phone: [phone]   Fax:  [phone]  Physical Therapy Evaluation  Patient Details  Name: Sonya Green MRN: [id number] Date of Birth: 06-Dec-1990 Referring Provider (PT): Earlie Server   Encounter Date: 05/14/2021   PT End of Session - 05/14/21 1654     Visit Number 1    Date for PT Re-Evaluation 09/14/21    Authorization Type [employer]    PT Start Time 7371    PT Stop Time 1650    PT Time Calculation (min) 35 min    Activity Tolerance Patient tolerated treatment well;No increased pain    Behavior During Therapy WFL for tasks assessed/performed             Past Medical History:  Diagnosis Date   Jaundice of newborn    Migraines     Past Surgical History:  Procedure Laterality Date   CESAREAN SECTION N/A 07/10/2017   Procedure: CESAREAN SECTION;  Surgeon: Osborne Oman, MD;  Location: [place];  Service: Obstetrics;  Laterality: N/A;   WISDOM TOOTH EXTRACTION      There were no vitals filed for this visit.    Subjective Assessment - 05/14/21 1621     Subjective Patient has been having vaginal pain for 1 year with standing on feet, picking up son or heavy things or when she is stressed.    Patient Stated Goals reduce pain    Currently in Pain? Yes    Pain Score 6     Pain Location Vagina    Pain Orientation Mid    Pain Descriptors / Indicators Sharp    Pain Type Chronic pain    Pain Onset More than a month ago    Pain Frequency Intermittent    Aggravating Factors  lifting son or something heavy, standing,    Pain Relieving Factors medication, laying down    Multiple Pain Sites No                [facility] PT Assessment - 05/14/21 0001       Assessment   Medical Diagnosis R10.2 VAginal pain    Referring Provider (PT) Terri Burleson    Onset Date/Surgical Date --   1 year ago   Prior Therapy none      Precautions    Precautions None      Restrictions   Weight Bearing Restrictions No      Balance Screen   Has the patient fallen in the past 6 months No    Has the patient had a decrease in activity level because of a fear of falling?  No    Is the patient reluctant to leave their home because of a fear of falling?  No      Home Ecologist residence      Prior Function   Level of Independence Independent    Vocation Part time employment    Vocation Requirements sitting    Leisure stretching and dancing      Cognition   Overall Cognitive Status Within Functional Limits for tasks assessed      Posture/Postural Control   Posture/Postural Control Postural limitations    Postural Limitations Forward head;Rounded Shoulders;Increased thoracic kyphosis      ROM / Strength   AROM / PROM / Strength PROM;AROM;Strength      AROM   Overall AROM Comments lumbar ROM is 25% limited  PROM   Right Hip External Rotation  50    Left Hip External Rotation  55      Strength   Overall Strength Comments abdominal strength is 1/5    Right Hip Extension 4/5    Right Hip Internal Rotation 4/5    Right Hip ABduction 4+/5    Right Hip ADduction 4/5    Left Hip Extension 3+/5    Left Hip Internal Rotation 4/5    Left Hip ABduction 3+/5    Left Hip ADduction 4/5      Palpation   Spinal mobility Decreased opening of the lower rib cage    Palpation comment pain in left lower quadrant; restictions in the c-section scar; tenderness located in the lower abdominals in midline                        Objective measurements completed on examination: See above findings.     Pelvic Floor Special Questions - 05/14/21 0001     Prior Pregnancies Yes    Number of Pregnancies 1    Number of C-Sections 1   07/10/2017   Diastasis Recti none    Currently Sexually Active Yes    Is this Painful No    Urinary Leakage No    Urinary frequency does not fully empty her  bladder    Fecal incontinence No    Falling out feeling (prolapse) Yes    Activities that cause feeling of prolapse standing and lifting    External Palpation just pain on the pubic symphysis    Exam Type Deferred   was in too much pain and not comfortable                     PT Education - 05/14/21 1653     Education Details Access Code: [email or web address]; posture; abdominal massage    Person(s) Educated Patient    Methods Explanation;Demonstration;Handout    Comprehension Verbalized understanding              PT Short Term Goals - 05/14/21 1702       PT SHORT TERM GOAL #1   Title independent with initial HEP    Baseline Not educated yet    Time 4    Period Weeks    Status New    Target Date 06/11/21               PT Long Term Goals - 05/14/21 1702       PT LONG TERM GOAL #1   Title independent with advanced core stabilization exercises    Baseline not educated yet    Time 4    Period Months    Status New    Target Date 09/14/21      PT LONG TERM GOAL #2   Title able to lift her son with abdominal bracing and pain decreased </= 1/10 due to improve core strength    Baseline pain level 6/10    Time 4    Period Months    Status New    Target Date 09/14/21      PT LONG TERM GOAL #3   Title able to stand with pressure and vaginal pain decreased </= 1/10 due to increased in core strength and stability    Time 4    Period Months    Status New    Target Date 09/14/21      PT LONG TERM GOAL #4  Title understand correct posture and able to stand with reduction of thoracic kyphosis to engage her abdominal and reduce pressure on the pelvic floor    Baseline stands with thoracic kyphosis, pooching of the abdomen    Time 4    Period Months    Status New    Target Date 09/14/21                    Plan - 05/14/21 1655     Clinical Impression Statement Patient is a 30 year old female with vaginal pain for the past year. She had a c-section  on 07/10/2017. Patient reports her pain level is 6/10 with lifting heavy items, lifting her sone, standing long period of times. Patient also has a falling out feeling with the same activities. Patient will be having an ultrasound in the next week to see if she has an ovarian cyst. Tenderness located on the lower abdominal midline and to the left. She has not diastasis. Abdominal strength is 1/5. She does not expand her rib cage and stays closed . Lumbar ROM is decreased by 25%.Hip External rotation on the right is 50 degres and left is 55 degrees for P/ROM. Bilateral hips are weak. No palpable tenderness located on the external perineum through her clothes. Patient deferred internal exam due to pain and nervous. Decreased mobility of the c-section scar. She does not empty her bladder fully. Patient will benefit from skilled therapy to reduce her pain and improve function.    Personal Factors and Comorbidities Fitness;Comorbidity 1    Comorbidities c=section x1    Examination-Activity Limitations Stand;Lift;Carry;Toileting    Stability/Clinical Decision Making Stable/Uncomplicated    Clinical Decision Making Low    Rehab Potential Excellent    PT Frequency 1x / week   every other week   PT Duration Other (comment)   4 months   PT Treatment/Interventions ADLs/Self Care Home Management;Biofeedback;Therapeutic activities;Therapeutic exercise;Neuromuscular re-education;Patient/family education;Manual techniques;Scar mobilization;Dry needling;Taping;Spinal Manipulations;Joint Manipulations    PT Next Visit Plan abdominal massage and fascial work; mobilization of the thoracic rib cage and lower rib cage; working on lower abdominal contraction, see how her [place] went    PT Home Exercise Plan Access Code: [email or web address]    Consulted and Agree with Plan of Care Patient             Patient will benefit from skilled therapeutic intervention in order to improve the following deficits and impairments:  Decreased range  of motion, Decreased coordination, Increased fascial restricitons, Pain, Decreased activity tolerance, Decreased strength, Decreased scar mobility  Visit Diagnosis: Muscle weakness (generalized) - Plan: PT plan of care cert/re-cert  Abdominal pain, unspecified abdominal location - Plan: PT plan of care cert/re-cert  Abnormal posture - Plan: PT plan of care cert/re-cert     Problem List Patient Active Problem List   Diagnosis Date Noted   LGSIL on Pap smear of cervix 08/13/2020   Encounter for initial prescription of injectable contraceptive 08/24/2017    Earlie Counts, PT 05/14/21 5:08 PM  [facility] [address], [place], [place], [address] Phone: [phone]   Fax:  [phone]  Name: Karole Oo MRN: [id number] Date of Birth: Jan 03, 1991  PHYSICAL THERAPY DISCHARGE SUMMARY  Visits from Start of Care: 1  Current functional level related to goals / functional outcomes: Called patient and she reported she wanted to be discharged due to feeling better.    Remaining deficits: Unable to fully assess patient due to her  not returning and feeling better.    Education / Equipment: HEP   Patient agrees to discharge. Patient goals were met. Patient is being discharged due to meeting the stated rehab goals. Thank you for the referral. Earlie Counts, PT 07/23/21 2:33 PM

## 2021-05-14 NOTE — Patient Instructions (Signed)
Access Code: [email or web address] URL: [email or web address] Date: 05/14/2021 Prepared by: Eulis Foster  Program Notes gently massage the lower abdomen for 2 minutes 1 time per day   Exercises Supine Thoracic Mobilization Towel Roll Horizontal - 1 x daily - 7 x weekly - 1 sets - 1 reps - 1 min hold Supine Diaphragmatic Breathing - 1 x daily - 7 x weekly - 1 sets - 10 reps [facility] [address] [place], [place] [address] Phone # [phone] Fax [phone]

## 2021-05-18 ENCOUNTER — Ambulatory Visit (HOSPITAL_COMMUNITY)
Admission: RE | Admit: 2021-05-18 | Discharge: 2021-05-18 | Disposition: A | Discharge status: Home / Self Care | Payer: Medicaid Other | Source: Ambulatory Visit | Attending: Medical | Admitting: Medical

## 2021-05-18 ENCOUNTER — Other Ambulatory Visit: Payer: Self-pay

## 2021-05-18 DIAGNOSIS — R102 Pelvic and perineal pain: Secondary | ICD-10-CM | POA: Insufficient documentation

## 2021-07-09 ENCOUNTER — Encounter: Payer: Medicaid Other | Attending: Nurse Practitioner | Admitting: Physical Therapy

## 2021-07-09 ENCOUNTER — Telehealth: Payer: Self-pay | Admitting: Physical Therapy

## 2021-07-09 NOTE — Telephone Encounter (Signed)
Called patient about her missed appointment today at 14:00. Left a message.  Eulis Foster, PT @9 /11/2020@ 2:19 PM

## 2021-07-15 ENCOUNTER — Telehealth: Payer: Self-pay | Admitting: Family Medicine

## 2021-07-15 NOTE — Telephone Encounter (Signed)
Patient want to change from Depo to a Birth Control pill

## 2021-07-15 NOTE — Telephone Encounter (Signed)
Called pt to explain that this will have to be reviewed by her provider. Last Depo Provera given 04/13/21. Pt has not used birth control pill in the past. Has only used Depo Provera in the past.

## 2021-07-16 ENCOUNTER — Other Ambulatory Visit: Payer: Self-pay | Admitting: Medical

## 2021-07-16 DIAGNOSIS — Z3009 Encounter for other general counseling and advice on contraception: Secondary | ICD-10-CM

## 2021-07-16 MED ORDER — NORGESTIMATE-ETH ESTRADIOL 0.25-35 MG-MCG PO TABS: 1.0000 | ORAL_TABLET | Freq: Every day | ORAL | 11 refills | Status: DC

## 2021-07-23 ENCOUNTER — Telehealth: Payer: Self-pay | Admitting: Physical Therapy

## 2021-07-23 ENCOUNTER — Encounter: Payer: Self-pay | Admitting: Physical Therapy

## 2021-07-23 NOTE — Telephone Encounter (Signed)
Called patient about her missed appointment today at 1400. She reported she is feeling better and wants to be discharged.  Eulis Foster, PT @9 /15/2022@ 2:29 PM

## 2021-08-06 ENCOUNTER — Encounter: Payer: Self-pay | Admitting: Physical Therapy

## 2022-01-26 ENCOUNTER — Telehealth: Payer: Self-pay

## 2022-01-26 NOTE — Telephone Encounter (Signed)
REFERRAL ONLY ?

## 2022-02-21 NOTE — Progress Notes (Deleted)
?Cardiology Office Note:   ? ?Date:  02/21/2022  ? ?ID:  Sonya Green, DOB 07-13-1991, MRN [id number] ? ?PCP:  Patient, No Pcp Per (Inactive)  ?Cardiologist:  None  ? ?Referring MD: No ref. provider found  ? ?No chief complaint on file. ? ? ?History of Present Illness:   ? ?Sonya Green is a 31 y.o. female with no hx of cardiac disease who is being seen evaluation of palpitations. CV risk factors include severe preeclampsia 2018. ? ?*** ? ?Past Medical History:  ?Diagnosis Date  ? Jaundice of newborn   ? Migraines   ? Palpitations   ? ? ?Past Surgical History:  ?Procedure Laterality Date  ? CESAREAN SECTION N/A 07/10/2017  ? Procedure: CESAREAN SECTION;  Surgeon: Tereso Newcomer, MD;  Location: [facility];  Service: Obstetrics;  Laterality: N/A;  ? WISDOM TOOTH EXTRACTION    ? ? ?Current Medications: ?No outpatient medications have been marked as taking for the 02/22/22 encounter (Appointment) with Lyn Records, MD.  ?  ? ?Allergies:   Spinach, Sulfa antibiotics, and Zithromax [azithromycin]  ? ?Social History  ? ?Socioeconomic History  ? Marital status: Single  ?  Spouse name: Not on file  ? Number of children: Not on file  ? Years of education: 43  ? Highest education level: Not on file  ?Occupational History  ? Occupation: Housekeeping  ?Tobacco Use  ? Smoking status: Never  ? Smokeless tobacco: Never  ?Substance and Sexual Activity  ? Alcohol use: No  ?  Comment: Occasionally  ? Drug use: No  ? Sexual activity: Not Currently  ?  Birth control/protection: None  ?Other Topics Concern  ? Not on file  ?Social History Narrative  ? Fun: Sports  ? Denies religious beliefs effecting health care.  ? Denies abuse and feels safe at home.   ? ?Social Determinants of Health  ? ?Financial Resource Strain: Not on file  ?Food Insecurity: No Food Insecurity  ? Worried About Programme researcher, broadcasting/film/video in the Last Year: Never true  ? Ran Out of Food in the Last Year: Never true  ?Transportation Needs: No Transportation  Needs  ? Lack of Transportation (Medical): No  ? Lack of Transportation (Non-Medical): No  ?Physical Activity: Not on file  ?Stress: Not on file  ?Social Connections: Not on file  ?  ? ?Family History: ?The patient's family history includes Arthritis in her maternal grandmother; Diabetes in her maternal grandmother and paternal grandmother; Healthy in her mother; Lung cancer in her maternal grandfather; Stroke in her maternal grandmother. ? ?ROS:   ?Please see the history of present illness.    ?*** All other systems reviewed and are negative. ? ?EKGs/Labs/Other Studies Reviewed:   ? ?The following studies were reviewed today: ?*** ? ?EKG:  EKG performed on 08/20/2020 reveals horizontal axis and rSr' in V 1. *** ? ?Recent Labs: ?No results found for requested labs within last 8760 hours.  ?Recent Lipid Panel ?No results found for: CHOL, TRIG, HDL, CHOLHDL, VLDL, LDLCALC, LDLDIRECT ? ?Physical Exam:   ? ?VS:  There were no vitals taken for this visit.   ? ?Wt Readings from Last 3 Encounters:  ?05/04/21 159 lb 6.4 oz (72.3 kg)  ?04/13/21 154 lb 11.2 oz (70.2 kg)  ?01/26/21 152 lb 3.2 oz (69 kg)  ?  ? ?GEN: ***. No acute distress ?HEENT: Normal ?NECK: No JVD. ?LYMPHATICS: No lymphadenopathy ?CARDIAC: *** murmur. RRR *** gallop, or edema. ?VASCULAR: *** Normal Pulses. No bruits. ?RESPIRATORY:  Clear to auscultation without rales, wheezing or rhonchi  ?ABDOMEN: Soft, non-tender, non-distended, No pulsatile mass, ?MUSCULOSKELETAL: No deformity  ?SKIN: Warm and dry ?NEUROLOGIC:  Alert and oriented x 3 ?PSYCHIATRIC:  Normal affect  ? ?ASSESSMENT:   ? ?1. Palpitations   ?2. Pre-eclampsia, antepartum   ? ?PLAN:   ? ?In order of problems listed above: ? ?*** ? ? ?Medication Adjustments/Labs and Tests Ordered: ?Current medicines are reviewed at length with the patient today.  Concerns regarding medicines are outlined above.  ?No orders of the defined types were placed in this encounter. ? ?No orders of the defined types were  placed in this encounter. ? ? ?There are no Patient Instructions on file for this visit.  ? ?Signed, ?Lesleigh Noe, MD  ?02/21/2022 8:57 AM    ?[facility] ?

## 2022-02-22 ENCOUNTER — Ambulatory Visit: Payer: Medicaid Other | Admitting: Interventional Cardiology

## 2022-02-22 ENCOUNTER — Encounter: Payer: Self-pay | Admitting: Interventional Cardiology

## 2022-02-22 DIAGNOSIS — O1490 Unspecified pre-eclampsia, unspecified trimester: Secondary | ICD-10-CM

## 2022-02-22 DIAGNOSIS — R002 Palpitations: Secondary | ICD-10-CM

## 2022-03-11 NOTE — Progress Notes (Deleted)
?Cardiology Office Note:   ? ?Date:  03/11/2022  ? ?ID:  Sonya Green, DOB April 25, 1991, MRN [id number] ? ?PCP:  Patient, No Pcp Per (Inactive) ?  ?[facility] Providers ?Cardiologist:  None { ? ? ?Referring MD: No ref. provider found  ? ? ?History of Present Illness:   ? ?Sonya Green is a 31 y.o. female with a hx of migraines who was referred by Hope Budds, PA for further evaluation of palpitations.  ? ?Patient was seen in clinic by Hope Budds, PA on 02/04/22. Note reviewed. Per report, was having episodes of palpitations that had been ongoing for the past couple of years. There, ECG with NSR with no significant ectopy. She was referred to Cardiology for further evaluation. ? ?Today, *** ? ?Past Medical History:  ?Diagnosis Date  ? Jaundice of newborn   ? Migraines   ? Palpitations   ? ? ?Past Surgical History:  ?Procedure Laterality Date  ? CESAREAN SECTION N/A 07/10/2017  ? Procedure: CESAREAN SECTION;  Surgeon: Tereso Newcomer, MD;  Location: [facility];  Service: Obstetrics;  Laterality: N/A;  ? WISDOM TOOTH EXTRACTION    ? ? ?Current Medications: ?No outpatient medications have been marked as taking for the 03/15/22 encounter (Appointment) with Meriam Sprague, MD.  ?  ? ?Allergies:   Spinach, Sulfa antibiotics, and Zithromax [azithromycin]  ? ?Social History  ? ?Socioeconomic History  ? Marital status: Single  ?  Spouse name: Not on file  ? Number of children: Not on file  ? Years of education: 73  ? Highest education level: Not on file  ?Occupational History  ? Occupation: Housekeeping  ?Tobacco Use  ? Smoking status: Never  ? Smokeless tobacco: Never  ?Substance and Sexual Activity  ? Alcohol use: No  ?  Comment: Occasionally  ? Drug use: No  ? Sexual activity: Not Currently  ?  Birth control/protection: None  ?Other Topics Concern  ? Not on file  ?Social History Narrative  ? Fun: Sports  ? Denies religious beliefs effecting health care.  ? Denies abuse and feels safe at home.    ? ?Social Determinants of Health  ? ?Financial Resource Strain: Not on file  ?Food Insecurity: No Food Insecurity  ? Worried About Programme researcher, broadcasting/film/video in the Last Year: Never true  ? Ran Out of Food in the Last Year: Never true  ?Transportation Needs: No Transportation Needs  ? Lack of Transportation (Medical): No  ? Lack of Transportation (Non-Medical): No  ?Physical Activity: Not on file  ?Stress: Not on file  ?Social Connections: Not on file  ?  ? ?Family History: ?The patient's ***family history includes Arthritis in her maternal grandmother; Diabetes in her maternal grandmother and paternal grandmother; Healthy in her mother; Lung cancer in her maternal grandfather; Stroke in her maternal grandmother. ? ?ROS:   ?Please see the history of present illness.    ?*** All other systems reviewed and are negative. ? ?EKGs/Labs/Other Studies Reviewed:   ? ?The following studies were reviewed today: ?*** ? ?EKG:  EKG is *** ordered today.  The ekg ordered today demonstrates *** ? ?Recent Labs: ?No results found for requested labs within last 8760 hours.  ?Recent Lipid Panel ?No results found for: CHOL, TRIG, HDL, CHOLHDL, VLDL, LDLCALC, LDLDIRECT ? ? ?Risk Assessment/Calculations:   ?{Does this patient have ATRIAL FIBRILLATION?:[phone]} ? ?    ? ?Physical Exam:   ? ?VS:  There were no vitals taken for this visit.   ? ?Wt  Readings from Last 3 Encounters:  ?05/04/21 159 lb 6.4 oz (72.3 kg)  ?04/13/21 154 lb 11.2 oz (70.2 kg)  ?01/26/21 152 lb 3.2 oz (69 kg)  ?  ? ?GEN: *** Well nourished, well developed in no acute distress ?HEENT: Normal ?NECK: No JVD; No carotid bruits ?LYMPHATICS: No lymphadenopathy ?CARDIAC: ***RRR, no murmurs, rubs, gallops ?RESPIRATORY:  Clear to auscultation without rales, wheezing or rhonchi  ?ABDOMEN: Soft, non-tender, non-distended ?MUSCULOSKELETAL:  No edema; No deformity  ?SKIN: Warm and dry ?NEUROLOGIC:  Alert and oriented x 3 ?PSYCHIATRIC:  Normal affect  ? ?ASSESSMENT:   ? ?No diagnosis  found. ?PLAN:   ? ?In order of problems listed above: ? ?#Palpitations: ?Has been ongoing for several years.  ?-Check 7 day zio monitor ?-Increase hydration ?-Cut back on caffeine ?-PO Mag at night ? ? ?   ? ?{Are you ordering a CV Procedure (e.g. stress test, cath, DCCV, TEE, etc)?   Press F2        :[id number]}  ? ? ?Medication Adjustments/Labs and Tests Ordered: ?Current medicines are reviewed at length with the patient today.  Concerns regarding medicines are outlined above.  ?No orders of the defined types were placed in this encounter. ? ?No orders of the defined types were placed in this encounter. ? ? ?There are no Patient Instructions on file for this visit.  ? ?Signed, ?Meriam Sprague, MD  ?03/11/2022 8:22 PM    ?[facility] ?

## 2022-03-15 ENCOUNTER — Ambulatory Visit (INDEPENDENT_AMBULATORY_CARE_PROVIDER_SITE_OTHER): Payer: Medicaid Other | Admitting: Cardiology

## 2022-03-15 ENCOUNTER — Encounter: Payer: Self-pay | Admitting: Cardiology

## 2022-03-15 ENCOUNTER — Ambulatory Visit: Payer: Medicaid Other

## 2022-03-15 VITALS — BP 114/74 | HR 81 | Ht 67.0 in | Wt 145.4 lb

## 2022-03-15 DIAGNOSIS — Z79899 Other long term (current) drug therapy: Secondary | ICD-10-CM | POA: Diagnosis not present

## 2022-03-15 DIAGNOSIS — R002 Palpitations: Secondary | ICD-10-CM

## 2022-03-15 LAB — TSH: TSH: 1.5 u[IU]/mL (ref 0.450–4.500)

## 2022-03-15 NOTE — Progress Notes (Unsigned)
Enrolled for [employer] to mail a ZIO XT long term holter monitor to the patients address on file.  

## 2022-03-15 NOTE — Progress Notes (Signed)
?Cardiology Office Note:   ? ?Date:  03/15/2022  ? ?ID:  Sonya Green, DOB Sep 09, 1991, MRN [id number] ? ?PCP:  Patient, No Pcp Per (Inactive) ?  ?[facility] Providers ?Cardiologist:  None { ? ? ?Referring MD: No ref. provider found  ? ? ?History of Present Illness:   ? ?Sonya Green is a 31 y.o. female with a hx of migraines who was referred by Hope Buddsarlton Beaman, PA for further evaluation of palpitations.  ? ?Patient was seen in clinic by Hope Buddsarlton Beaman, PA on 02/04/22. Note reviewed. Per report, was having episodes of palpitations that had been ongoing for the past couple of years. There, ECG with NSR with no significant ectopy. She was referred to Cardiology for further evaluation. ? ?Today, the patient states that she has been suffering from worsening palpitations over several years. Her palpitations then became less frequent and less intense after working on a healthier diet.  At this time she experiences episodes of palpitations about 2-3 times a month, usually when she eats dark chocolate or other sweets. She describes the palpitations as feeling like "her heart is squeezing." Her palpitations may last anywhere from a couple hours to the entire day. She feels associated lightheadedness; sometimes with shortness of breath and chest pain as well.  She denies noticing any of the above symptoms while walking or exercising. ? ?Typically she does not drink much caffeine. She has been trying to work on staying well hydrated and eating more fruits and vegetables. ? ?She denies any peripheral edema. No headaches, syncope, orthopnea, or PND. ? ? ?Past Medical History:  ?Diagnosis Date  ? Jaundice of newborn   ? Migraines   ? Palpitations   ? ? ?Past Surgical History:  ?Procedure Laterality Date  ? CESAREAN SECTION N/A 07/10/2017  ? Procedure: CESAREAN SECTION;  Surgeon: Tereso NewcomerAnyanwu, Ugonna A, MD;  Location: [facility];  Service: Obstetrics;  Laterality: N/A;  ? WISDOM TOOTH EXTRACTION    ? ? ?Current  Medications: ?Current Meds  ?Medication Sig  ? Aspirin-Acetaminophen-Caffeine (EXCEDRIN PO) Take 1 tablet by mouth every 6 (six) hours as needed (HEADACHE).  ? ibuprofen (ADVIL) 200 MG tablet Take 200 mg by mouth every 6 (six) hours as needed.  ? Menthol, Topical Analgesic, (ICY HOT EX) Apply 1 application topically daily as needed (pain).  ? Multiple Vitamin (MULTIVITAMIN WITH MINERALS) TABS tablet Take 1 tablet by mouth daily.  ? norgestimate-ethinyl estradiol (ORTHO-CYCLEN) 0.25-35 MG-MCG tablet Take 1 tablet by mouth daily.  ?  ? ?Allergies:   Spinach, Sulfa antibiotics, and Zithromax [azithromycin]  ? ?Social History  ? ?Socioeconomic History  ? Marital status: Single  ?  Spouse name: Not on file  ? Number of children: Not on file  ? Years of education: 3412  ? Highest education level: Not on file  ?Occupational History  ? Occupation: Housekeeping  ?Tobacco Use  ? Smoking status: Never  ? Smokeless tobacco: Never  ?Substance and Sexual Activity  ? Alcohol use: No  ?  Comment: Occasionally  ? Drug use: No  ? Sexual activity: Not Currently  ?  Birth control/protection: None  ?Other Topics Concern  ? Not on file  ?Social History Narrative  ? Fun: Sports  ? Denies religious beliefs effecting health care.  ? Denies abuse and feels safe at home.   ? ?Social Determinants of Health  ? ?Financial Resource Strain: Not on file  ?Food Insecurity: No Food Insecurity  ? Worried About Programme researcher, broadcasting/film/videounning Out of Food in the Last Year:  Never true  ? Ran Out of Food in the Last Year: Never true  ?Transportation Needs: No Transportation Needs  ? Lack of Transportation (Medical): No  ? Lack of Transportation (Non-Medical): No  ?Physical Activity: Not on file  ?Stress: Not on file  ?Social Connections: Not on file  ?  ? ?Family History: ?The patient's family history includes Arthritis in her maternal grandmother; Diabetes in her maternal grandmother and paternal grandmother; Healthy in her mother; Lung cancer in her maternal grandfather; Stroke  in her maternal grandmother. ? ?ROS:   ?Review of Systems  ?Constitutional:  Negative for chills and fever.  ?HENT:  Negative for hearing loss and nosebleeds.   ?Eyes:  Negative for double vision.  ?Respiratory:  Positive for shortness of breath. Negative for cough and hemoptysis.   ?Cardiovascular:  Positive for chest pain and palpitations. Negative for orthopnea, claudication, leg swelling and PND.  ?Gastrointestinal:  Negative for blood in stool and vomiting.  ?Genitourinary:  Negative for dysuria.  ?Musculoskeletal:  Negative for falls.  ?Neurological:  Positive for dizziness. Negative for tremors.  ?Endo/Heme/Allergies:  Does not bruise/bleed easily.  ?Psychiatric/Behavioral:  Negative for hallucinations and suicidal ideas. The patient is nervous/anxious.   ? ? ?EKGs/Labs/Other Studies Reviewed:   ? ?The following studies were reviewed today: ? ?CTA Chest 06/18/2017: ?FINDINGS: ?Cardiovascular: Heart is normal in size. Thoracic aorta and takeoff ?of great vessels normal. Pulmonary arterial system well opacified ?without evidence of emboli. ?  ?Mediastinum/Nodes: No evidence of mediastinal or hilar adenopathy. ?Remaining mediastinal structures are within normal. ?  ?Lungs/Pleura: Lungs are adequately inflated without airspace ?consolidation or effusion. Airways are normal. ?  ?Upper Abdomen: Within normal. ?  ?Musculoskeletal: Within normal. ?  ?Review of the MIP images confirms the above findings. ?  ?IMPRESSION: ?No acute cardiopulmonary disease. No evidence of pulmonary embolism. ? ? ?EKG:  EKG is personally reviewed. ?03/15/2022: Sinus rhythm. Rate 81 bpm. ? ? ?Recent Labs: ?No results found for requested labs within last 8760 hours.  ? ?Recent Lipid Panel ?No results found for: CHOL, TRIG, HDL, CHOLHDL, VLDL, LDLCALC, LDLDIRECT ? ? ?Risk Assessment/Calculations:   ?  ? ?    ? ?Physical Exam:   ? ?VS:  BP 114/74   Pulse 81   Ht 5\' 7"  (1.702 m)   Wt 145 lb 6.4 oz (66 kg)   SpO2 97%   BMI 22.77 kg/m?     ? ?Wt Readings from Last 3 Encounters:  ?03/15/22 145 lb 6.4 oz (66 kg)  ?05/04/21 159 lb 6.4 oz (72.3 kg)  ?04/13/21 154 lb 11.2 oz (70.2 kg)  ?  ? ?GEN: Well nourished, well developed in no acute distress ?HEENT: Normal ?NECK: No JVD; No carotid bruits ?CARDIAC: RRR, no murmurs, rubs, gallops ?RESPIRATORY:  Clear to auscultation without rales, wheezing or rhonchi  ?ABDOMEN: Soft, non-tender, non-distended ?MUSCULOSKELETAL:  No edema; No deformity  ?SKIN: Warm and dry ?NEUROLOGIC:  Alert and oriented x 3 ?PSYCHIATRIC:  Normal affect  ? ?ASSESSMENT:   ? ?1. Palpitations   ?2. Medication management   ? ?PLAN:   ? ?In order of problems listed above: ? ?#Palpitations: ?Has been ongoing for several years. Symptoms are worsened with certain foods like chocolate or sweets. Has some associated SOB/lightheadedness/chest discomfort. Since making dietary changes, the symptoms are less frequent. Discussed option of monitoring with Kardia device versus zio monitor and she would like to proceed with zio monitor at this time.  ?-Check 14 day zio monitor ?-Increase hydration ?-Avoid food triggers  like chocolate and sugary snacks ?-PO Mag at night ?-Patient has cut out caffeine ? ? ?   ? ?   ?Follow-up:  6 months. ? ?Medication Adjustments/Labs and Tests Ordered: ?Current medicines are reviewed at length with the patient today.  Concerns regarding medicines are outlined above.  ? ?Orders Placed This Encounter  ?Procedures  ? TSH  ? LONG TERM MONITOR (3-14 DAYS)  ? EKG 12-Lead  ? ?No orders of the defined types were placed in this encounter. ? ?Patient Instructions  ?Medication Instructions:  ? ?*If you need a refill on your cardiac medications before your next appointment, please call your pharmacy* ? ? ?Lab Work: ?TSH  ? ?If you have labs (blood work) drawn today and your tests are completely normal, you will receive your results only by: ?MyChart Message (if you have MyChart) OR ?A paper copy in the mail ?If you have any lab  test that is abnormal or we need to change your treatment, we will call you to review the results. ? ? ?Testing/Procedures: ?ZIO XT- Long Term Monitor Instructions ? ?Your physician has requested you wear a ZIO patch monitor

## 2022-03-15 NOTE — Patient Instructions (Addendum)
Medication Instructions:  ? ?*If you need a refill on your cardiac medications before your next appointment, please call your pharmacy* ? ? ?Lab Work: ?TSH  ? ?If you have labs (blood work) drawn today and your tests are completely normal, you will receive your results only by: ?MyChart Message (if you have MyChart) OR ?A paper copy in the mail ?If you have any lab test that is abnormal or we need to change your treatment, we will call you to review the results. ? ? ?Testing/Procedures: ?ZIO XT- Long Term Monitor Instructions ? ?Your physician has requested you wear a ZIO patch monitor for 14 days.  ?This is a single patch monitor. [employer] supplies one patch monitor per enrollment. Additional ?stickers are not available. Please do not apply patch if you will be having a Nuclear Stress Test,  ?Echocardiogram, Cardiac CT, MRI, or Chest Xray during the period you would be wearing the  ?monitor. The patch cannot be worn during these tests. You cannot remove and re-apply the  ?ZIO XT patch monitor.  ?Your ZIO patch monitor will be mailed 3 day USPS to your address on file. It may take 3-5 days  ?to receive your monitor after you have been enrolled.  ?Once you have received your monitor, please review the enclosed instructions. Your monitor  ?has already been registered assigning a specific monitor serial # to you. ? ?Billing and Patient Assistance Program Information ? ?We have supplied [employer] with any of your insurance information on file for billing purposes. ?[employer] offers a sliding scale Patient Assistance Program for patients that do not have  ?insurance, or whose insurance does not completely cover the cost of the ZIO monitor.  ?You must apply for the Patient Assistance Program to qualify for this discounted rate.  ?To apply, please call [employer] at [phone], select option 4, select option 2, ask to apply for  ?Patient Assistance Program. Meredeth Ide will ask your household income, and how many people  ?are in  your household. They will quote your out-of-pocket cost based on that information.  ?[employer] will also be able to set up a 37-month, interest-free payment plan if needed. ? ?Applying the monitor ?  ?Shave hair from upper left chest.  ?Hold abrader disc by orange tab. Rub abrader in 40 strokes over the upper left chest as  ?indicated in your monitor instructions.  ?Clean area with 4 enclosed alcohol pads. Let dry.  ?Apply patch as indicated in monitor instructions. Patch will be placed under collarbone on left  ?side of chest with arrow pointing upward.  ?Rub patch adhesive wings for 2 minutes. Remove white label marked "1". Remove the white  ?label marked "2". Rub patch adhesive wings for 2 additional minutes.  ?While looking in a mirror, press and release button in center of patch. A small green light will  ?flash 3-4 times. This will be your only indicator that the monitor has been turned on.  ?Do not shower for the first 24 hours. You may shower after the first 24 hours.  ?Press the button if you feel a symptom. You will hear a small click. Record Date, Time and  ?Symptom in the Patient Logbook.  ?When you are ready to remove the patch, follow instructions on the last 2 pages of Patient  ?Logbook. Stick patch monitor onto the last page of Patient Logbook.  ?Place Patient Logbook in the blue and white box. Use locking tab on box and tape box closed  ?securely. The blue and white box  has prepaid postage on it. Please place it in the mailbox as  ?soon as possible. Your physician should have your test results approximately 7 days after the  ?monitor has been mailed back to [facility].  ?Call [facility] at [phone] if you have questions regarding  ?your ZIO XT patch monitor. Call them immediately if you see an orange light blinking on your  ?monitor.  ?If your monitor falls off in less than 4 days, contact our Monitor department at [phone].  ?If your monitor becomes loose or falls off  after 4 days call [employer] at [phone] for  ?suggestions on securing your monitor ? ? ? ?Follow-Up: ?At [facility], you and your health needs are our priority.  As part of our continuing mission to provide you with exceptional heart care, we have created designated Provider Care Teams.  These Care Teams include your primary Cardiologist (physician) and Advanced Practice Providers (APPs -  Physician Assistants and Nurse Practitioners) who all work together to provide you with the care you need, when you need it. ? ?We recommend signing up for the patient portal called "MyChart".  Sign up information is provided on this After Visit Summary.  MyChart is used to connect with patients for Virtual Visits (Telemedicine).  Patients are able to view lab/test results, encounter notes, upcoming appointments, etc.  Non-urgent messages can be sent to your provider as well.   ?To learn more about what you can do with MyChart, go to [email or web address].   ? ?Your next appointment:   ?6 month(s) ? ?The format for your next appointment:   ?In Person ? ?Dr Laurance Flatten  ? ?If primary card or EP is not listed click here to update    :1}  ? ? ?Other Instructions ? ? ?Important Information About Sugar ? ? ? ? ?  ?

## 2022-09-25 NOTE — Progress Notes (Deleted)
Cardiology Office Note:    Date:  09/25/2022   ID:  [name], DOB 03-Jan-1991, MRN [id number]  PCP:  Patient, No Pcp Per   [facility] Providers Cardiologist:  None {   Referring MD: No ref. provider found    History of Present Illness:    Sonya Green is a 31 y.o. female with a hx of migraines and palpitations who presents to clinic for follow-up.  Patient was seen in clinic by Hope Budds, PA on 02/04/22 where she was having episodes of palpitations that had been ongoing for the past couple of years. There, ECG with NSR with no significant ectopy. She was referred to Cardiology for further evaluation.  Was initially seen by me in 03/2022 for palpitations. Was recommended for zio but this was not performed.   Today, ***   Past Medical History:  Diagnosis Date   Jaundice of newborn    Migraines    Palpitations     Past Surgical History:  Procedure Laterality Date   CESAREAN SECTION N/A 07/10/2017   Procedure: CESAREAN SECTION;  Surgeon: Tereso Newcomer, MD;  Location: [facility];  Service: Obstetrics;  Laterality: N/A;   WISDOM TOOTH EXTRACTION      Current Medications: No outpatient medications have been marked as taking for the 09/27/22 encounter (Appointment) with Meriam Sprague, MD.     Allergies:   Spinach, Sulfa antibiotics, and Zithromax [azithromycin]   Social History   Socioeconomic History   Marital status: Single    Spouse name: Not on file   Number of children: Not on file   Years of education: 12   Highest education level: Not on file  Occupational History   Occupation: Housekeeping  Tobacco Use   Smoking status: Never   Smokeless tobacco: Never  Substance and Sexual Activity   Alcohol use: No    Comment: Occasionally   Drug use: No   Sexual activity: Not Currently    Birth control/protection: None  Other Topics Concern   Not on file  Social History Narrative   Fun: Sports   Denies religious beliefs  effecting health care.   Denies abuse and feels safe at home.    Social Determinants of Health   Financial Resource Strain: Not on file  Food Insecurity: No Food Insecurity (05/04/2021)   Hunger Vital Sign    Worried About Running Out of Food in the Last Year: Never true    Ran Out of Food in the Last Year: Never true  Transportation Needs: No Transportation Needs (05/04/2021)   PRAPARE - Administrator, Civil Service (Medical): No    Lack of Transportation (Non-Medical): No  Physical Activity: Not on file  Stress: Not on file  Social Connections: Not on file     Family History: The patient's family history includes Arthritis in her maternal grandmother; Diabetes in her maternal grandmother and paternal grandmother; Healthy in her mother; Lung cancer in her maternal grandfather; Stroke in her maternal grandmother.  ROS:   Review of Systems  Constitutional:  Negative for chills and fever.  HENT:  Negative for hearing loss and nosebleeds.   Eyes:  Negative for double vision.  Respiratory:  Positive for shortness of breath. Negative for cough and hemoptysis.   Cardiovascular:  Positive for chest pain and palpitations. Negative for orthopnea, claudication, leg swelling and PND.  Gastrointestinal:  Negative for blood in stool and vomiting.  Genitourinary:  Negative for dysuria.  Musculoskeletal:  Negative for falls.  Neurological:  Positive for dizziness. Negative for tremors.  Endo/Heme/Allergies:  Does not bruise/bleed easily.  Psychiatric/Behavioral:  Negative for hallucinations and suicidal ideas. The patient is nervous/anxious.      EKGs/Labs/Other Studies Reviewed:    The following studies were reviewed today:  CTA Chest 06/18/2017: FINDINGS: Cardiovascular: Heart is normal in size. Thoracic aorta and takeoff of great vessels normal. Pulmonary arterial system well opacified without evidence of emboli.   Mediastinum/Nodes: No evidence of mediastinal or hilar  adenopathy. Remaining mediastinal structures are within normal.   Lungs/Pleura: Lungs are adequately inflated without airspace consolidation or effusion. Airways are normal.   Upper Abdomen: Within normal.   Musculoskeletal: Within normal.   Review of the MIP images confirms the above findings.   IMPRESSION: No acute cardiopulmonary disease. No evidence of pulmonary embolism.   EKG:  EKG is personally reviewed. 03/15/2022: Sinus rhythm. Rate 81 bpm.   Recent Labs: 03/15/2022: TSH 1.500   Recent Lipid Panel No results found for: "CHOL", "TRIG", "HDL", "CHOLHDL", "VLDL", "LDLCALC", "LDLDIRECT"   Risk Assessment/Calculations:           Physical Exam:    VS:  There were no vitals taken for this visit.    Wt Readings from Last 3 Encounters:  03/15/22 145 lb 6.4 oz (66 kg)  05/04/21 159 lb 6.4 oz (72.3 kg)  04/13/21 154 lb 11.2 oz (70.2 kg)     GEN: Well nourished, well developed in no acute distress HEENT: Normal NECK: No JVD; No carotid bruits CARDIAC: RRR, no murmurs, rubs, gallops RESPIRATORY:  Clear to auscultation without rales, wheezing or rhonchi  ABDOMEN: Soft, non-tender, non-distended MUSCULOSKELETAL:  No edema; No deformity  SKIN: Warm and dry NEUROLOGIC:  Alert and oriented x 3 PSYCHIATRIC:  Normal affect   ASSESSMENT:    No diagnosis found.  PLAN:    In order of problems listed above:  #Palpitations: Has been ongoing for several years. Symptoms are worsened with certain foods like chocolate or sweets. Has some associated SOB/lightheadedness/chest discomfort. Since making dietary changes, the symptoms are less frequent. Was recommended for zio but this was not performed. Currently, *** -Zio not completed -Increase hydration -Avoid food triggers like chocolate and sugary snacks -PO Mag at night -Patient has cut out caffeine          Follow-up:  6 months.  Medication Adjustments/Labs and Tests Ordered: Current medicines are reviewed at  length with the patient today.  Concerns regarding medicines are outlined above.   No orders of the defined types were placed in this encounter.  No orders of the defined types were placed in this encounter.  There are no Patient Instructions on file for this visit.    I,Mathew Stumpf,acting as a Neurosurgeon for Meriam Sprague, MD.,have documented all relevant documentation on the behalf of Meriam Sprague, MD,as directed by  Meriam Sprague, MD while in the presence of Meriam Sprague, MD.  I, Meriam Sprague, MD, have reviewed all documentation for this visit. The documentation on 09/25/22 for the exam, diagnosis, procedures, and orders are all accurate and complete.   Signed, Meriam Sprague, MD  09/25/2022 2:25 PM    [facility]

## 2022-09-27 ENCOUNTER — Ambulatory Visit: Payer: Medicaid Other | Attending: Cardiology | Admitting: Cardiology

## 2022-09-30 ENCOUNTER — Emergency Department (HOSPITAL_BASED_OUTPATIENT_CLINIC_OR_DEPARTMENT_OTHER)
Admission: EM | Admit: 2022-09-30 | Discharge: 2022-09-30 | Disposition: A | Discharge status: Home / Self Care | Payer: Medicaid Other | Attending: Emergency Medicine | Admitting: Emergency Medicine

## 2022-09-30 ENCOUNTER — Encounter (HOSPITAL_BASED_OUTPATIENT_CLINIC_OR_DEPARTMENT_OTHER): Payer: Self-pay

## 2022-09-30 ENCOUNTER — Emergency Department (HOSPITAL_BASED_OUTPATIENT_CLINIC_OR_DEPARTMENT_OTHER): Payer: Medicaid Other | Admitting: Radiology

## 2022-09-30 DIAGNOSIS — R079 Chest pain, unspecified: Secondary | ICD-10-CM | POA: Diagnosis not present

## 2022-09-30 DIAGNOSIS — J069 Acute upper respiratory infection, unspecified: Secondary | ICD-10-CM | POA: Diagnosis not present

## 2022-09-30 DIAGNOSIS — B9789 Other viral agents as the cause of diseases classified elsewhere: Secondary | ICD-10-CM | POA: Diagnosis not present

## 2022-09-30 DIAGNOSIS — R059 Cough, unspecified: Secondary | ICD-10-CM | POA: Diagnosis not present

## 2022-09-30 DIAGNOSIS — Z1152 Encounter for screening for COVID-19: Secondary | ICD-10-CM | POA: Insufficient documentation

## 2022-09-30 LAB — RESP PANEL BY RT-PCR (FLU A&B, COVID) ARPGX2
Influenza A by PCR: NEGATIVE
Influenza B by PCR: NEGATIVE
SARS Coronavirus 2 by RT PCR: NEGATIVE

## 2022-09-30 LAB — GROUP A STREP BY PCR: Group A Strep by PCR: NOT DETECTED

## 2022-09-30 MED ORDER — LIDOCAINE VISCOUS HCL 2 % MT SOLN
15.0000 mL | Freq: Once | OROMUCOSAL | Status: AC
  Administered 2022-09-30: 15 mL via OROMUCOSAL
  Filled 2022-09-30: qty 15

## 2022-09-30 NOTE — Discharge Instructions (Signed)
You were seen in the emergency department today for cough and ongoing flulike symptoms.  You do not have pneumonia.  Your COVID, flu, strep testing is negative.  Please continue to use over-the-counter cough and cold medications as well as Tylenol and Motrin for fever relief.  Please return to the emergency department if you have shortness of breath or difficulty breathing.  Or if you have inability to swallow liquids.  Otherwise please follow-up with your primary care provider.  I provided you a work note

## 2022-09-30 NOTE — ED Triage Notes (Signed)
Pt presents POV from home, NAD  Pt reports non productive cough, chest congestion, and sore throat x6   Pt reports chest pain only when coughing, states she does not feel her chest pain is related to her heart, states, "it's from all the coughing, I feel like my chest muscle hurt from coughing"   Pt reports taking, Dayquil, TheraFlu, benadryl and decongestant with no relief. Pt reports her son and his class room were recently sick with the same.

## 2022-09-30 NOTE — ED Triage Notes (Signed)
Pt now reports her chest pain started after lifting a heavy patient at work. 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

## 2022-09-30 NOTE — ED Notes (Signed)
Dc instructions reviewed with patient. Patient voiced understanding. Dc with belongings.  °

## 2022-09-30 NOTE — ED Provider Notes (Signed)
[facility] EMERGENCY DEPT Provider Note   CSN: [id number] Arrival date & time: 09/30/22  1231     History  Chief Complaint  Patient presents with   Cough   Sore Throat   Chest Pain    Sonya Green is a 31 y.o. female.  With past medical history of migraines who presents to the emergency department with cough.  Patient states that she had flulike symptoms that began last weekend.  She describes having cough, sore throat and congestion.  States that she never had fevers.  She states that most of the flulike symptoms have abated but she has ongoing cough.  States that she has chest pain with her coughing.  Feels intermittently short of breath.  She denies having chest pain when she is not coughing.  Denies having nausea, vomiting, diarrhea. She has been using over-the-counter medications like DayQuil, TheraFlu with no relief.  Has been eating and drinking appropriate   Cough Associated symptoms: chest pain, shortness of breath and sore throat   Associated symptoms: no fever   Sore Throat Associated symptoms include chest pain and shortness of breath.  Chest Pain Associated symptoms: cough and shortness of breath   Associated symptoms: no dysphagia and no fever        Home Medications Prior to Admission medications   Medication Sig Start Date End Date Taking? Authorizing Provider  Aspirin-Acetaminophen-Caffeine (EXCEDRIN PO) Take 1 tablet by mouth every 6 (six) hours as needed (HEADACHE).    [provider]  ibuprofen (ADVIL) 200 MG tablet Take 200 mg by mouth every 6 (six) hours as needed.    [provider]  medroxyPROGESTERone ([name]) 150 MG/ML injection Inject 150 mg into the muscle every 3 (three) months. Patient not taking: Reported on 03/15/2022    [provider]  Menthol, Topical Analgesic, (ICY HOT EX) Apply 1 application topically daily as needed (pain).    [provider]  Multiple Vitamin (MULTIVITAMIN WITH  MINERALS) TABS tablet Take 1 tablet by mouth daily.    [provider]  norgestimate-ethinyl estradiol (ORTHO-CYCLEN) 0.25-35 MG-MCG tablet Take 1 tablet by mouth daily. 07/16/21   Marny Lowenstein, PA-C      Allergies    Spinach, Sulfa antibiotics, and Zithromax [azithromycin]    Review of Systems   Review of Systems  Constitutional:  Negative for fever.  HENT:  Positive for congestion and sore throat. Negative for trouble swallowing.   Respiratory:  Positive for cough and shortness of breath.   Cardiovascular:  Positive for chest pain.    Physical Exam Updated Vital Signs BP 126/86   Pulse 91   Temp 99 F (37.2 C) (Oral)   Resp 15   SpO2 100%  Physical Exam Vitals and nursing note reviewed.  Constitutional:      General: She is not in acute distress.    Appearance: Normal appearance. She is well-developed and normal weight. She is not ill-appearing or toxic-appearing.  HENT:     Head: Normocephalic.     Nose: Congestion present.     Mouth/Throat:     Mouth: Mucous membranes are moist.     Pharynx: Uvula midline. Posterior oropharyngeal erythema present. No oropharyngeal exudate.     Tonsils: No tonsillar exudate or tonsillar abscesses. 1+ on the right. 1+ on the left.  Eyes:     General: No scleral icterus. Cardiovascular:     Rate and Rhythm: Normal rate and regular rhythm.     Heart sounds: Normal heart sounds. No  murmur heard. Pulmonary:     Effort: Pulmonary effort is normal. No respiratory distress.     Breath sounds: Normal breath sounds. No wheezing, rhonchi or rales.  Abdominal:     General: Bowel sounds are normal.     Palpations: Abdomen is soft.  Skin:    General: Skin is warm and dry.     Capillary Refill: Capillary refill takes less than 2 seconds.  Neurological:     General: No focal deficit present.     Mental Status: She is alert.  Psychiatric:        Mood and Affect: Mood normal.        Behavior: Behavior normal.     ED Results /  Procedures / Treatments   Labs (all labs ordered are listed, but only abnormal results are displayed) Labs Reviewed  GROUP A STREP BY PCR  RESP PANEL BY RT-PCR (FLU A&B, COVID) [id number]    EKG None  Radiology DG Chest 2 View  Result Date: 09/30/2022 CLINICAL DATA:  Chest pain, cough. EXAM: CHEST - 2 VIEW COMPARISON:  None Available. FINDINGS: The heart size and mediastinal contours are within normal limits. Both lungs are clear. The visualized skeletal structures are unremarkable. IMPRESSION: No active cardiopulmonary disease. Electronically Signed   By: Larose Hires D.O.   On: 09/30/2022 13:58    Procedures Procedures   Medications Ordered in ED Medications  lidocaine (XYLOCAINE) 2 % viscous mouth solution 15 mL (has no administration in time range)    ED Course/ Medical Decision Making/ A&P                           Medical Decision Making Amount and/or Complexity of Data Reviewed Radiology: ordered.  Risk Prescription drug management.  Initial Impression and Ddx Well-appearing, 31 year old female, nonseptic nontoxic in appearance and hemodynamically stable.  Lungs are clear bilaterally.  She does have some oropharyngeal erythema without evidence of PTA or tonsillar exudates.  Based on her history so far it sounds like she has had a viral upper respiratory illness with cough.  She is concerned about having pneumonia. Will obtain COVID/flu/strep and a chest x-ray. Patient PMH that increases complexity of ED encounter: Migraines  Interpretation of Diagnostics I independent reviewed and interpreted the labs as followed: COVID, flu, strep negative  - I independently visualized the following imaging with scope of interpretation limited to determining acute life threatening conditions related to emergency care: Chest x-ray, which revealed no acute findings  Patient Reassessment and Ultimate Disposition/Management On reassessment she is continuing to be well-appearing.   Discussed the results of her testing with her.  She is reassured by this.  And that she has viral upper respiratory illness with ongoing cough.  It sounds like the majority of her symptoms are resolving except for residual cough which we discussed could last for weeks at most.  We discussed over-the-counter remedies for this.  She verbalized understanding.  She is requesting a work note which I have provided for her. Do not think that her symptoms are related to a bacterial pneumonia, PTA, RPA, Ludwig's angina, bacterial tracheitis, epiglottitis.  She is tolerating food and liquids without difficulty.  There is no trismus.  Will have her follow-up with primary care on a as needed basis.  Otherwise feel that she is safe for discharge at this time.  Patient management required discussion with the following services or consulting groups:  None  Complexity of Problems Addressed Acute complicated  illness or Injury  Additional Data Reviewed and Analyzed Further history obtained from: Care Everywhere  Patient Encounter Risk Assessment None  Final Clinical Impression(s) / ED Diagnoses Final diagnoses:  Viral URI with cough    Rx / DC Orders ED Discharge Orders     None         Cristopher Peru, PA-C 09/30/22 1410    Derwood Kaplan, MD 09/30/22 1441

## 2022-10-05 ENCOUNTER — Telehealth: Payer: Self-pay | Admitting: Family Medicine

## 2022-10-05 NOTE — Telephone Encounter (Signed)
Called pt to let her know she could not be prescribed depo until she is seen for an annual exam. Pt last annual exam was 04/2021. Pt agreeable. Message sent to front office to schedule her for annual exam.

## 2022-10-05 NOTE — Telephone Encounter (Signed)
Would like to switch from pills birth control to depo

## 2022-11-11 ENCOUNTER — Other Ambulatory Visit (HOSPITAL_COMMUNITY)
Admission: RE | Admit: 2022-11-11 | Discharge: 2022-11-11 | Disposition: A | Discharge status: Home / Self Care | Payer: BC Managed Care – PPO | Source: Ambulatory Visit | Attending: Obstetrics and Gynecology | Admitting: Obstetrics and Gynecology

## 2022-11-11 ENCOUNTER — Encounter: Payer: Self-pay | Admitting: Obstetrics and Gynecology

## 2022-11-11 ENCOUNTER — Other Ambulatory Visit: Payer: Self-pay

## 2022-11-11 ENCOUNTER — Ambulatory Visit (INDEPENDENT_AMBULATORY_CARE_PROVIDER_SITE_OTHER): Payer: BC Managed Care – PPO | Admitting: Obstetrics and Gynecology

## 2022-11-11 VITALS — BP 129/85 | HR 80 | Ht 67.0 in | Wt 150.4 lb

## 2022-11-11 DIAGNOSIS — Z3202 Encounter for pregnancy test, result negative: Secondary | ICD-10-CM

## 2022-11-11 DIAGNOSIS — Z3042 Encounter for surveillance of injectable contraceptive: Secondary | ICD-10-CM

## 2022-11-11 DIAGNOSIS — Z113 Encounter for screening for infections with a predominantly sexual mode of transmission: Secondary | ICD-10-CM

## 2022-11-11 LAB — POCT PREGNANCY, URINE: Preg Test, Ur: NEGATIVE

## 2022-11-11 MED ORDER — MEDROXYPROGESTERONE ACETATE 150 MG/ML IM SUSP
150.0000 mg | Freq: Once | INTRAMUSCULAR | Status: AC
  Administered 2022-11-11: 150 mg via INTRAMUSCULAR

## 2022-11-11 NOTE — Progress Notes (Signed)
    GYNECOLOGY VISIT  Patient name: Sonya Green MRN [id number]  Date of birth: 17-May-1991 Chief Complaint:   Annual Exam   History:  Sonya Green is a 32 y.o. G1P0101 being seen today for wanting to go on depo. . Has previously been on depo without issues. Had been using pills, not wanting to continue. Having unprotected intercourse with partner.  Would like STI testing. LMP ~2 days ago and no intercourse in the interim.   Patient's last menstrual period was 11/10/2022 (exact date).   Past Medical History:  Diagnosis Date   Jaundice of newborn    Migraines    Palpitations     Past Surgical History:  Procedure Laterality Date   CESAREAN SECTION N/A 07/10/2017   Procedure: CESAREAN SECTION;  Surgeon: Osborne Oman, MD;  Location: [place];  Service: Obstetrics;  Laterality: N/A;   WISDOM TOOTH EXTRACTION      The following portions of the patient's history were reviewed and updated as appropriate: allergies, current medications, past family history, past medical history, past social history, past surgical history and problem list.   Health Maintenance:   Last pap 04/2021. Results were: NILM w/ HRHPV not done. H/O abnormal pap: yes Last mammogram: n/a.    Review of Systems:  Pertinent items are noted in HPI. Comprehensive review of systems was otherwise negative.   Objective:  Physical Exam BP 129/85   Pulse 80   Ht 5\' 7"  (1.702 m)   Wt 150 lb 6.4 oz (68.2 kg)   LMP 11/10/2022 (Exact Date)   BMI 23.56 kg/m    Physical Exam Vitals and nursing note reviewed. Exam conducted with a chaperone present.  Constitutional:      Appearance: Normal appearance.  HENT:     Head: Normocephalic and atraumatic.  Cardiovascular:     Rate and Rhythm: Normal rate and regular rhythm.  Pulmonary:     Effort: Pulmonary effort is normal.     Breath sounds: Normal breath sounds.  Genitourinary:    General: Normal vulva.     Exam position: Lithotomy position.      Comments: Normal vulva and introitus Skin:    General: Skin is warm and dry.  Neurological:     General: No focal deficit present.     Mental Status: She is alert.  Psychiatric:        Mood and Affect: Mood normal.        Behavior: Behavior normal.        Thought Content: Thought content normal.        Judgment: Judgment normal.     Labs and Imaging No results found.     Assessment & Plan:   1. Routine screening for STI (sexually transmitted infection) Vaginal swabs and serum testing today - Cervicovaginal ancillary only - RPR+HBsAg+HCVAb+...  2. Encounter for [name] contraception Reasonably sure not pregnant today. Reviewed all forms of birth control options available including abstinence; over the counter/barrier methods; hormonal contraceptive medication including pill, patch, ring, injection,contraceptive implant; hormonal and nonhormonal IUDs; permanent sterilization options including vasectomy and the various tubal sterilization modalities. Risks and benefits reviewed.  Questions were answered.   - Pregnancy, urine POC - medroxyPROGESTERone ([name]) injection 150 mg   Routine preventative health maintenance measures emphasized.  Darliss Cheney, MD [facility] [place]

## 2022-11-11 NOTE — Progress Notes (Signed)
Sonya Green here for [name] Injection. Injection administered without complication. Patient will return in 3 months for next injection between 03/22 and 04/15. Martina Sinner, RN 11/11/2022  4:33 PM

## 2022-11-12 LAB — CERVICOVAGINAL ANCILLARY ONLY
Bacterial Vaginitis (gardnerella): NEGATIVE
Candida Glabrata: NEGATIVE
Candida Vaginitis: NEGATIVE
Chlamydia: NEGATIVE
Comment: NEGATIVE
Comment: NEGATIVE
Comment: NEGATIVE
Comment: NEGATIVE
Comment: NEGATIVE
Comment: NORMAL
Neisseria Gonorrhea: NEGATIVE
Trichomonas: NEGATIVE

## 2022-11-12 LAB — RPR+HBSAG+HCVAB+...
HIV Screen 4th Generation wRfx: NONREACTIVE
Hep C Virus Ab: NONREACTIVE
Hepatitis B Surface Ag: NEGATIVE
RPR Ser Ql: NONREACTIVE

## 2023-02-01 ENCOUNTER — Telehealth: Payer: Self-pay | Admitting: *Deleted

## 2023-02-01 NOTE — Telephone Encounter (Signed)
Patient left a voice message she has appointment scheduled 02/03/23 and wants to reschedule to 02/24/23 11am. Per review has nurse visit for depo-provera. I called and informed her I can reschedule but should be scheduled by 02/10/23. She agreed to new appt. Fernando Stoiber,RN

## 2023-02-03 ENCOUNTER — Ambulatory Visit: Payer: Medicaid Other

## 2023-02-10 ENCOUNTER — Ambulatory Visit (INDEPENDENT_AMBULATORY_CARE_PROVIDER_SITE_OTHER): Payer: BC Managed Care – PPO

## 2023-02-10 ENCOUNTER — Other Ambulatory Visit: Payer: Self-pay

## 2023-02-10 VITALS — BP 123/68 | HR 85 | Wt 152.6 lb

## 2023-02-10 DIAGNOSIS — Z3042 Encounter for surveillance of injectable contraceptive: Secondary | ICD-10-CM

## 2023-02-10 MED ORDER — MEDROXYPROGESTERONE ACETATE 150 MG/ML IM SUSY
150.0000 mg | PREFILLED_SYRINGE | Freq: Once | INTRAMUSCULAR | Status: AC
  Administered 2023-02-10: 150 mg via INTRAMUSCULAR

## 2023-02-10 NOTE — Progress Notes (Signed)
Sonya Green here for [name] Injection. Injection administered without complication. Patient will return in 3 months for next injection between 04/28/23 and 05/12/23. Next annual visit due January 2025.   Annabell Howells, RN 02/10/2023  10:42 AM

## 2023-02-15 ENCOUNTER — Encounter (HOSPITAL_BASED_OUTPATIENT_CLINIC_OR_DEPARTMENT_OTHER): Payer: Self-pay

## 2023-02-15 ENCOUNTER — Emergency Department (HOSPITAL_BASED_OUTPATIENT_CLINIC_OR_DEPARTMENT_OTHER): Payer: BC Managed Care – PPO | Admitting: Radiology

## 2023-02-15 ENCOUNTER — Emergency Department (HOSPITAL_BASED_OUTPATIENT_CLINIC_OR_DEPARTMENT_OTHER)
Admission: EM | Admit: 2023-02-15 | Discharge: 2023-02-15 | Disposition: A | Discharge status: Home / Self Care | Payer: BC Managed Care – PPO | Attending: Emergency Medicine | Admitting: Emergency Medicine

## 2023-02-15 ENCOUNTER — Other Ambulatory Visit: Payer: Self-pay

## 2023-02-15 DIAGNOSIS — R0789 Other chest pain: Secondary | ICD-10-CM | POA: Insufficient documentation

## 2023-02-15 DIAGNOSIS — R079 Chest pain, unspecified: Secondary | ICD-10-CM | POA: Diagnosis not present

## 2023-02-15 LAB — CBC
HCT: 39.9 % (ref 36.0–46.0)
Hemoglobin: 13.8 g/dL (ref 12.0–15.0)
MCH: 30.6 pg (ref 26.0–34.0)
MCHC: 34.6 g/dL (ref 30.0–36.0)
MCV: 88.5 fL (ref 80.0–100.0)
Platelets: 225 10*3/uL (ref 150–400)
RBC: 4.51 MIL/uL (ref 3.87–5.11)
RDW: 11.7 % (ref 11.5–15.5)
WBC: 3.8 10*3/uL — ABNORMAL LOW (ref 4.0–10.5)
nRBC: 0 % (ref 0.0–0.2)

## 2023-02-15 LAB — BASIC METABOLIC PANEL
Anion gap: 7 (ref 5–15)
BUN: 6 mg/dL (ref 6–20)
CO2: 27 mmol/L (ref 22–32)
Calcium: 9.6 mg/dL (ref 8.9–10.3)
Chloride: 105 mmol/L (ref 98–111)
Creatinine, Ser: 0.73 mg/dL (ref 0.44–1.00)
GFR, Estimated: >60 mL/min (ref >60)
Glucose, Bld: 63 mg/dL — ABNORMAL LOW (ref 70–99)
Potassium: 3.9 mmol/L (ref 3.5–5.1)
Sodium: 139 mmol/L (ref 135–145)

## 2023-02-15 LAB — TROPONIN I (HIGH SENSITIVITY): Troponin I (High Sensitivity): <2 ng/L (ref <18)

## 2023-02-15 LAB — HCG, SERUM, QUALITATIVE: Preg, Serum: NEGATIVE

## 2023-02-15 MED ORDER — IBUPROFEN 800 MG PO TABS
800.0000 mg | ORAL_TABLET | Freq: Once | ORAL | Status: AC
  Administered 2023-02-15: 800 mg via ORAL
  Filled 2023-02-15: qty 1

## 2023-02-15 NOTE — Discharge Instructions (Signed)
Please take tylenol/ibuprofen for pain. I recommend close follow-up with a primary care physician for reevaluation.  Please do not hesitate to return to emergency department if worrisome signs symptoms we discussed become apparent.

## 2023-02-15 NOTE — ED Triage Notes (Signed)
Patient here POV from Home.  Endorses Mid CP Intermittently for 4 Days. Some Dizziness and SOB. No N/V. No Fever or Cough. Mostly Heavy or Sharp.   NAD Noted during Triage. A&Ox4. GCS 15. Ambulatory.

## 2023-02-15 NOTE — ED Notes (Signed)
RN reviewed discharge instructions with pt. Pt verbalized understanding and had no further questions. VSS upon discharge.  

## 2023-02-15 NOTE — ED Provider Notes (Signed)
[place] EMERGENCY DEPARTMENT AT [facility] Provider Note   CSN: [id number] Arrival date & time: 02/15/23  1316     History  Chief Complaint  Patient presents with   Chest Pain    Sonya Green is a 32 y.o. female otherwise healthy presents today for evaluation of chest pain.  Patient reports she has had intermittent chest pain in the last 4 days.  Pain is located in left side of her chest, pressure-like, intermittent, worse with anxiety.  States she felt something sitting on her chest.  Each episode lasted about 30 minutes.  Denies any acid reflux symptoms.  Denies any sweating, extremity weakness and numbness.  No recent travel or surgery.  No personal or family history of DVT/PE.  Denies any shortness of breath.  Has not tried any medication at home for pain.   Chest Pain     Past Medical History:  Diagnosis Date   Jaundice of newborn    Migraines    Palpitations    Past Surgical History:  Procedure Laterality Date   CESAREAN SECTION N/A 07/10/2017   Procedure: CESAREAN SECTION;  Surgeon: Tereso Newcomer, MD;  Location: [facility];  Service: Obstetrics;  Laterality: N/A;   WISDOM TOOTH EXTRACTION       Home Medications Prior to Admission medications   Medication Sig Start Date End Date Taking? Authorizing Provider  Ascorbic Acid (VITAMIN C PO) Take by mouth.    [provider]  Multiple Vitamins-Minerals (MULTIPLE VITAMINS/WOMENS PO) Take by mouth.    [provider]      Allergies    Spinach, Sulfa antibiotics, and Zithromax [azithromycin]    Review of Systems   Review of Systems  Cardiovascular:  Positive for chest pain.    Physical Exam Updated Vital Signs BP 127/84 (BP Location: Right Arm)   Pulse 79   Temp 98.4 F (36.9 C)   Resp 18   Ht 5\' 7"  (1.702 m)   Wt 65.8 kg   SpO2 100%   BMI 22.71 kg/m  Physical Exam Vitals and nursing note reviewed.  Constitutional:      Appearance: Normal appearance.  HENT:      Head: Normocephalic and atraumatic.     Mouth/Throat:     Mouth: Mucous membranes are moist.  Eyes:     General: No scleral icterus. Cardiovascular:     Rate and Rhythm: Normal rate and regular rhythm.     Pulses: Normal pulses.     Heart sounds: Normal heart sounds.  Pulmonary:     Effort: Pulmonary effort is normal.     Breath sounds: Normal breath sounds.  Abdominal:     General: Abdomen is flat.     Palpations: Abdomen is soft.     Tenderness: There is no abdominal tenderness.  Musculoskeletal:        General: No deformity.  Skin:    General: Skin is warm.     Findings: No rash.  Neurological:     General: No focal deficit present.     Mental Status: She is alert.  Psychiatric:        Mood and Affect: Mood normal.     ED Results / Procedures / Treatments   Labs (all labs ordered are listed, but only abnormal results are displayed) Labs Reviewed  BASIC METABOLIC PANEL - Abnormal; Notable for the following components:      Result Value   Glucose, Bld 63 (*)    All other components within normal  limits  CBC - Abnormal; Notable for the following components:   WBC 3.8 (*)    All other components within normal limits  HCG, SERUM, QUALITATIVE  TROPONIN I (HIGH SENSITIVITY)  TROPONIN I (HIGH SENSITIVITY)    EKG EKG Interpretation  Date/Time:  Tuesday February 15 2023 13:23:43 EDT Ventricular Rate:  81 PR Interval:  138 QRS Duration: 92 QT Interval:  356 QTC Calculation: 413 R Axis:   91 Text Interpretation: Normal sinus rhythm Rightward axis Incomplete right bundle branch block Borderline ECG No significant change since last tracing When compared with ECG of 20-Aug-2018 23:15, PREVIOUS ECG IS PRESENT Confirmed by Vivien RossettiBranham, Victoria ([id number]) on 02/15/2023 3:27:25 PM  Radiology DG Chest 2 View  Result Date: 02/15/2023 CLINICAL DATA:  Chest pain EXAM: CHEST - 2 VIEW COMPARISON:  Chest radiograph 09/30/2022 FINDINGS: The cardiomediastinal contours are within normal  limits. The lungs are clear. No pneumothorax or pleural effusion. No acute finding in the visualized skeleton. IMPRESSION: No acute cardiopulmonary finding. Electronically Signed   By: Emmaline KluverNancy  Ballantyne M.D.   On: 02/15/2023 13:44    Procedures Procedures    Medications Ordered in ED Medications - No data to display  ED Course/ Medical Decision Making/ A&P                             Medical Decision Making Risk Prescription drug management.   This patient presents to the ED for chest pain, this involves an extensive number of treatment options, and is a complaint that carries with a high risk of complications and morbidity.  The differential diagnosis includes ACS, pericarditis, PE, pneumothorax, pneumonia, dissection.  This is not an exhaustive list.  Lab tests: I ordered and personally interpreted labs.  The pertinent results include: WBC unremarkable. Hbg unremarkable. Platelets unremarkable. Electrolytes unremarkable. BUN, creatinine unremarkable. Troponin unremarkable.  Imaging studies: I ordered imaging studies. I personally reviewed, interpreted imaging and agree with the radiologist's interpretations. The results include: Chest x-ray normal.  Problem list/ ED course/ Critical interventions/ Medical management: HPI: See above Vital signs within normal range and stable throughout visit. Laboratory/imaging studies significant for: See above. On physical examination, patient is afebrile and appears in no acute distress. Exam without evidence of volume overload so doubt heart failure. EKG without signs of active ischemia. Given the timing of pain to ER presentation, single troponin was negative so doubt NSTEMI. Presentation not consistent with acute PE (PERC negative), pneumothorax (not visualized on chest xr), thoracic aortic dissection, pericarditis, tamponade, pneumonia (no infectious symptoms, clear chest xr), myocarditis (no recent illness, neg trop). HEART score:0 so plan to  discharge patient home with PCP follow up.  I have reviewed the patient home medicines and have made adjustments as needed.  Cardiac monitoring/EKG: The patient was maintained on a cardiac monitor.  I personally reviewed and interpreted the cardiac monitor which showed an underlying rhythm of: sinus rhythm.  Additional history obtained: External records from outside source obtained and reviewed including: Chart review including previous notes, labs, imaging.  Consultations obtained:  Disposition Continued outpatient therapy. Follow-up with PCP recommended for reevaluation of symptoms. Treatment plan discussed with patient.  Pt acknowledged understanding was agreeable to the plan. Worrisome signs and symptoms were discussed with patient, and patient acknowledged understanding to return to the ED if they noticed these signs and symptoms. Patient was stable upon discharge.   This chart was dictated using voice recognition software.  Despite best efforts to proofread,  errors can occur which can change the documentation meaning.          Final Clinical Impression(s) / ED Diagnoses Final diagnoses:  Chest pain, unspecified type    Rx / DC Orders ED Discharge Orders     None         Jeanelle Malling, [place] 02/15/23 1738    Mardene Sayer, MD 02/16/23 1125

## 2023-03-30 ENCOUNTER — Other Ambulatory Visit (HOSPITAL_BASED_OUTPATIENT_CLINIC_OR_DEPARTMENT_OTHER): Payer: Self-pay

## 2023-03-30 ENCOUNTER — Encounter (HOSPITAL_BASED_OUTPATIENT_CLINIC_OR_DEPARTMENT_OTHER): Payer: Self-pay | Admitting: Emergency Medicine

## 2023-03-30 ENCOUNTER — Emergency Department (HOSPITAL_BASED_OUTPATIENT_CLINIC_OR_DEPARTMENT_OTHER): Payer: BC Managed Care – PPO

## 2023-03-30 ENCOUNTER — Other Ambulatory Visit: Payer: Self-pay

## 2023-03-30 ENCOUNTER — Emergency Department (HOSPITAL_BASED_OUTPATIENT_CLINIC_OR_DEPARTMENT_OTHER)
Admission: EM | Admit: 2023-03-30 | Discharge: 2023-03-30 | Disposition: A | Discharge status: Home / Self Care | Payer: BC Managed Care – PPO | Attending: Emergency Medicine | Admitting: Emergency Medicine

## 2023-03-30 DIAGNOSIS — N3001 Acute cystitis with hematuria: Secondary | ICD-10-CM | POA: Diagnosis not present

## 2023-03-30 DIAGNOSIS — R109 Unspecified abdominal pain: Secondary | ICD-10-CM

## 2023-03-30 DIAGNOSIS — R1011 Right upper quadrant pain: Secondary | ICD-10-CM | POA: Diagnosis not present

## 2023-03-30 DIAGNOSIS — R1030 Lower abdominal pain, unspecified: Secondary | ICD-10-CM | POA: Diagnosis not present

## 2023-03-30 DIAGNOSIS — R1031 Right lower quadrant pain: Secondary | ICD-10-CM | POA: Diagnosis not present

## 2023-03-30 LAB — CBC WITH DIFFERENTIAL/PLATELET
Abs Immature Granulocytes: 0.01 10*3/uL (ref 0.00–0.07)
Basophils Absolute: 0 10*3/uL (ref 0.0–0.1)
Basophils Relative: 0 %
Eosinophils Absolute: 0 10*3/uL (ref 0.0–0.5)
Eosinophils Relative: 1 %
HCT: 42.9 % (ref 36.0–46.0)
Hemoglobin: 14.6 g/dL (ref 12.0–15.0)
Immature Granulocytes: 0 %
Lymphocytes Relative: 15 %
Lymphs Abs: 1.1 10*3/uL (ref 0.7–4.0)
MCH: 30.4 pg (ref 26.0–34.0)
MCHC: 34 g/dL (ref 30.0–36.0)
MCV: 89.2 fL (ref 80.0–100.0)
Monocytes Absolute: 0.4 10*3/uL (ref 0.1–1.0)
Monocytes Relative: 6 %
Neutro Abs: 5.5 10*3/uL (ref 1.7–7.7)
Neutrophils Relative %: 78 %
Platelets: 223 10*3/uL (ref 150–400)
RBC: 4.81 MIL/uL (ref 3.87–5.11)
RDW: 11.9 % (ref 11.5–15.5)
WBC: 7.1 10*3/uL (ref 4.0–10.5)
nRBC: 0 % (ref 0.0–0.2)

## 2023-03-30 LAB — COMPREHENSIVE METABOLIC PANEL
ALT: 6 U/L (ref 0–44)
AST: 10 U/L — ABNORMAL LOW (ref 15–41)
Albumin: 4.1 g/dL (ref 3.5–5.0)
Alkaline Phosphatase: 43 U/L (ref 38–126)
Anion gap: 7 (ref 5–15)
BUN: 8 mg/dL (ref 6–20)
CO2: 26 mmol/L (ref 22–32)
Calcium: 8.9 mg/dL (ref 8.9–10.3)
Chloride: 106 mmol/L (ref 98–111)
Creatinine, Ser: 0.72 mg/dL (ref 0.44–1.00)
GFR, Estimated: >60 mL/min (ref >60)
Glucose, Bld: 98 mg/dL (ref 70–99)
Potassium: 3.4 mmol/L — ABNORMAL LOW (ref 3.5–5.1)
Sodium: 139 mmol/L (ref 135–145)
Total Bilirubin: 0.9 mg/dL (ref 0.3–1.2)
Total Protein: 6.6 g/dL (ref 6.5–8.1)

## 2023-03-30 LAB — URINALYSIS, ROUTINE W REFLEX MICROSCOPIC
Bilirubin Urine: NEGATIVE
Glucose, UA: NEGATIVE mg/dL
Ketones, ur: NEGATIVE mg/dL
Nitrite: NEGATIVE
Protein, ur: NEGATIVE mg/dL
Specific Gravity, Urine: <1.005 — ABNORMAL LOW (ref 1.005–1.030)
WBC, UA: >50 WBC/hpf (ref 0–5)
pH: 6.5 (ref 5.0–8.0)

## 2023-03-30 LAB — LIPASE, BLOOD: Lipase: 48 U/L (ref 11–51)

## 2023-03-30 LAB — PREGNANCY, URINE: Preg Test, Ur: NEGATIVE

## 2023-03-30 MED ORDER — ONDANSETRON HCL 4 MG/2ML IJ SOLN
4.0000 mg | Freq: Once | INTRAMUSCULAR | Status: AC
  Administered 2023-03-30: 4 mg via INTRAVENOUS
  Filled 2023-03-30: qty 2

## 2023-03-30 MED ORDER — KETOROLAC TROMETHAMINE 15 MG/ML IJ SOLN: 15.0000 mg | Freq: Once | INTRAMUSCULAR | Status: DC

## 2023-03-30 MED ORDER — SODIUM CHLORIDE 0.9 % IV SOLN
1.0000 g | INTRAVENOUS | Status: AC
  Administered 2023-03-30: 1 g via INTRAVENOUS
  Filled 2023-03-30: qty 10

## 2023-03-30 MED ORDER — CEPHALEXIN 500 MG PO CAPS
500.0000 mg | ORAL_CAPSULE | Freq: Two times a day (BID) | ORAL | 0 refills | Status: AC
  Filled 2023-03-30: qty 20, 10d supply, fill #0

## 2023-03-30 MED ORDER — MORPHINE SULFATE (PF) 4 MG/ML IV SOLN
4.0000 mg | Freq: Once | INTRAVENOUS | Status: AC
  Administered 2023-03-30: 4 mg via INTRAVENOUS
  Filled 2023-03-30: qty 1

## 2023-03-30 MED ORDER — IOHEXOL 300 MG/ML  SOLN
100.0000 mL | Freq: Once | INTRAMUSCULAR | Status: AC | PRN
  Administered 2023-03-30: 80 mL via INTRAVENOUS

## 2023-03-30 NOTE — ED Notes (Signed)
Pt given discharge instructions and reviewed prescriptions. Opportunities given for questions. Pt verbalizes understanding. PIV removed x1. Joletta Manner R, RN 

## 2023-03-30 NOTE — ED Provider Notes (Signed)
[place] EMERGENCY DEPARTMENT AT [facility] Provider Note   CSN: [id number] Arrival date & time: 03/30/23  9562     History  Chief Complaint  Patient presents with   Abdominal Pain    Sonya Green is a 32 y.o. female.  32 year old female with self-reported gallstones who presents emergency department with back pain and right-sided abdominal pain since yesterday.  Started off as a soreness in her back from her trapezius down to her lower back.  Worse with movement.  Has been lifting lots of things recently to prepare for a wedding.  Has been having urinary frequency as well.  No dysuria or hematuria.  Today started having abdominal pain in the front in her right upper quadrant and right lower quadrant.  No nausea or vomiting.  No diarrhea.  No fevers.  Only abdominal surgery was having a cyst removed in her right upper quadrant when she was a child.  Still has her gallbladder and appendix.       Home Medications Prior to Admission medications   Medication Sig Start Date End Date Taking? Authorizing Provider  cephALEXin (KEFLEX) 500 MG capsule Take 1 capsule (500 mg total) by mouth 2 (two) times daily for 10 days. 03/30/23 04/09/23 Yes Rondel Baton, MD  Ascorbic Acid (VITAMIN C PO) Take by mouth.    [provider]  Multiple Vitamins-Minerals (MULTIPLE VITAMINS/WOMENS PO) Take by mouth.    [provider]      Allergies    Spinach, Sulfa antibiotics, and Zithromax [azithromycin]    Review of Systems   Review of Systems  Physical Exam Updated Vital Signs BP 125/75 (BP Location: Right Arm)   Pulse 80   Temp 98.1 F (36.7 C) (Oral)   Resp 18   Wt 65.8 kg   SpO2 100%   BMI 22.71 kg/m  Physical Exam Vitals and nursing note reviewed.  Constitutional:      General: She is not in acute distress.    Appearance: She is well-developed.  HENT:     Head: Normocephalic and atraumatic.     Right Ear: External ear normal.     Left Ear:  External ear normal.     Nose: Nose normal.  Eyes:     Extraocular Movements: Extraocular movements intact.     Conjunctiva/sclera: Conjunctivae normal.     Pupils: Pupils are equal, round, and reactive to light.  Pulmonary:     Effort: Pulmonary effort is normal.  Abdominal:     General: Abdomen is flat. There is no distension.     Palpations: Abdomen is soft. There is no mass.     Tenderness: There is abdominal tenderness (RUQ). There is right CVA tenderness. There is no left CVA tenderness or guarding.     Comments: Surgical scar in right upper quadrant that she reports was due to cyst removal as a child  Musculoskeletal:     Cervical back: Normal range of motion and neck supple.     Right lower leg: No edema.     Left lower leg: No edema.  Skin:    General: Skin is warm and dry.  Neurological:     Mental Status: She is alert and oriented to person, place, and time. Mental status is at baseline.  Psychiatric:        Mood and Affect: Mood normal.     ED Results / Procedures / Treatments   Labs (all labs ordered are listed, but only abnormal results are displayed) Labs  Reviewed  COMPREHENSIVE METABOLIC PANEL - Abnormal; Notable for the following components:      Result Value   Potassium 3.4 (*)    AST 10 (*)    All other components within normal limits  URINALYSIS, ROUTINE W REFLEX MICROSCOPIC - Abnormal; Notable for the following components:   APPearance HAZY (*)    Specific Gravity, Urine <1.005 (*)    Hgb urine dipstick MODERATE (*)    Leukocytes,Ua LARGE (*)    Bacteria, UA RARE (*)    All other components within normal limits  LIPASE, BLOOD  CBC WITH DIFFERENTIAL/PLATELET  PREGNANCY, URINE    EKG None  Radiology CT ABDOMEN PELVIS W CONTRAST  Result Date: 03/30/2023 CLINICAL DATA:  Lower abdominal pain and back pain. Urinary frequency. Symptoms worse on the right. EXAM: CT ABDOMEN AND PELVIS WITH CONTRAST TECHNIQUE: Multidetector CT imaging of the abdomen and  pelvis was performed using the standard protocol following bolus administration of intravenous contrast. RADIATION DOSE REDUCTION: This exam was performed according to the departmental dose-optimization program which includes automated exposure control, adjustment of the mA and/or kV according to patient size and/or use of iterative reconstruction technique. CONTRAST:  80mL OMNIPAQUE IOHEXOL 300 MG/ML  SOLN COMPARISON:  Ultrasound 05/18/2021.  CT 07/26/2015. FINDINGS: Lower chest: Lung bases are clear. Hepatobiliary: Liver parenchyma is normal.  No calcified gallstones. Pancreas: Normal Spleen: Normal Adrenals/Urinary Tract: Adrenal glands are normal. Kidneys are normal. No cyst, mass, stone or hydronephrosis. No sign of inflammatory disease. Bladder appears normal. Stomach/Bowel: Stomach and small intestine are normal. Normal appendix. No abnormal colon finding. Vascular/Lymphatic: No abnormal vascular finding.  No adenopathy. Reproductive: Uterus and adnexal regions are normal by CT. Other: No free fluid or air.  No hernia. Musculoskeletal: Normal IMPRESSION: Normal examination. No abnormality seen to explain the clinical presentation. Electronically Signed   By: Paulina Fusi M.D.   On: 03/30/2023 10:45    Procedures Procedures    Medications Ordered in ED Medications  ondansetron (ZOFRAN) injection 4 mg (4 mg Intravenous Given 03/30/23 0915)  morphine (PF) 4 MG/ML injection 4 mg (4 mg Intravenous Given 03/30/23 0915)  iohexol (OMNIPAQUE) 300 MG/ML solution 100 mL (80 mLs Intravenous Contrast Given 03/30/23 1030)  cefTRIAXone (ROCEPHIN) 1 g in sodium chloride 0.9 % 100 mL IVPB (0 g Intravenous Stopped 03/30/23 1220)    ED Course/ Medical Decision Making/ A&P Clinical Course as of 04/01/23 1010  Wed Mar 30, 2023  1117 Re-examined and now having RLQ pain. CT negative for appendicitis. No RUQ pain at this time so do not feel RUQ [place] is warranted. UA shows possible UTI so will treat for possible pyelo.   [RP]    Clinical Course User Index [RP] Rondel Baton, MD                             Medical Decision Making Amount and/or Complexity of Data Reviewed Labs: ordered. Radiology: ordered.  Risk Prescription drug management.   Sonya Green is a 32 y.o. female with comorbidities that complicate the patient evaluation including self-reported gallstones and cyst removal as a child who dents to the emergency department with back pain and right-sided abdominal pain  Initial Ddx:  Muscle strain, kidney stone, gallstone, appendicitis  MDM:  Unclear exactly what is causing the patient's symptoms at this time as it does not fit clearly with any of the above diagnoses.  Could potentially be a muscle strain from her lifting.  With her urinary frequency and back pain could represent a kidney stone or pyelonephritis.  Is tender to palpation in her right upper quadrant so could have a gallstone.  Also told me after the initial interview that she was having some right lower quadrant pain which could represent appendicitis.  Plan:  Labs Urinalysis Urine pregnancy CT abdomen pelvis IV contrast Morphine Zofran  ED Summary/Re-evaluation:  CT scan resulted without any abnormalities.  Patient was feeling better after the above intervention.  Was found to have possible urinary tract infection so was given IV ceftriaxone in case her symptoms are due to pyelonephritis.  Was also given prescription to take at home and feel that she is suitable for discharge since he is stable and not septic.  Return precautions discussed prior to discharge.  This patient presents to the ED for concern of complaints listed in HPI, this involves an extensive number of treatment options, and is a complaint that carries with it a high risk of complications and morbidity. Disposition including potential need for admission considered.   Dispo: DC Home. Return precautions discussed including, but not limited to, those  listed in the AVS. Allowed pt time to ask questions which were answered fully prior to dc.  Records reviewed Outpatient Clinic Notes The following labs were independently interpreted: Urinalysis and show urinary tract infection I independently reviewed the following imaging with scope of interpretation limited to determining acute life threatening conditions related to emergency care: CT Abdomen/Pelvis and agree with the radiologist interpretation with the following exceptions: none I personally reviewed and interpreted cardiac monitoring: normal sinus rhythm  I personally reviewed and interpreted the pt's EKG: see above for interpretation  I have reviewed the patients home medications and made adjustments as needed        Final Clinical Impression(s) / ED Diagnoses Final diagnoses:  Right sided abdominal pain  Acute cystitis with hematuria    Rx / DC Orders ED Discharge Orders          Ordered    cephALEXin (KEFLEX) 500 MG capsule  2 times daily        03/30/23 1251              Rondel Baton, MD 04/01/23 1010

## 2023-03-30 NOTE — ED Triage Notes (Signed)
Pt arrives pov, steady gait with c/o RT side lower back pain that started yesterday, RLQ pain radiating to RT flank today with urinary frequency. Denies fever

## 2023-05-05 ENCOUNTER — Other Ambulatory Visit: Payer: Self-pay

## 2023-05-05 ENCOUNTER — Ambulatory Visit (INDEPENDENT_AMBULATORY_CARE_PROVIDER_SITE_OTHER): Payer: BC Managed Care – PPO

## 2023-05-05 VITALS — BP 97/79 | HR 72 | Ht 67.0 in | Wt 153.3 lb

## 2023-05-05 DIAGNOSIS — Z3042 Encounter for surveillance of injectable contraceptive: Secondary | ICD-10-CM | POA: Diagnosis not present

## 2023-05-05 MED ORDER — MEDROXYPROGESTERONE ACETATE 150 MG/ML IM SUSY
150.0000 mg | PREFILLED_SYRINGE | Freq: Once | INTRAMUSCULAR | Status: AC
  Administered 2023-05-05: 150 mg via INTRAMUSCULAR

## 2023-05-05 NOTE — Progress Notes (Signed)
Sonya Green here for [name] Injection. Injection administered without complication. Patient will return in 3 months for next injection between Sept 12 and Sept 26. Next annual visit due now.   Patient advised to schedule annual visit when check out.   Ralene Bathe, RN 05/05/2023  10:41 AM

## 2023-06-23 ENCOUNTER — Ambulatory Visit: Payer: BC Managed Care – PPO | Admitting: Obstetrics and Gynecology

## 2023-07-14 ENCOUNTER — Ambulatory Visit: Payer: BC Managed Care – PPO | Admitting: Family Medicine

## 2023-07-18 ENCOUNTER — Ambulatory Visit: Payer: BC Managed Care – PPO | Admitting: Advanced Practice Midwife

## 2023-07-18 NOTE — Progress Notes (Deleted)
   Subjective:     Sonya Green is a 32 y.o. female here at [facility] *** for a routine exam.  Current complaints: ***.  Personal health questionnaire reviewed: {yes/no:9010}.  Do you have a primary care provider? *** Do you feel safe at home? ***  Flowsheet Row Office Visit [facility]  PHQ-2 Total Score 0       Health Maintenance Due  Topic Date Due   COVID-19 Vaccine (1) Never done   INFLUENZA VACCINE  06/09/2023     Risk factors for chronic health problems: Smoking: Alchohol/how much: Pt BMI: There is no height or weight on file to calculate BMI.   Gynecologic History No LMP recorded. Patient has had an injection. Contraception: {method:5051} Last Pap: ***. Results were: {norm/abn:16337} Last mammogram: ***. Results were: {norm/abn:16337}  Obstetric History OB History  Gravida Para Term Preterm AB Living  1 1   1   1   SAB IAB Ectopic Multiple Live Births        0 1    # Outcome Date GA Lbr Len/2nd Weight Sex Type Anes PTL Lv  1 Preterm 07/10/17 [redacted]w[redacted]d  5 lb 3.6 oz (2.37 kg) M CS-LTranv EPI  LIV     {Common ambulatory SmartLinks:19316}  Review of Systems {ros; complete:30496}    Objective:  There were no vitals taken for this visit.  VS reviewed, nursing note reviewed,  Constitutional: well developed, well nourished, no distress HEENT: normocephalic, thyroid without enlargement or mass HEART: RRR, no murmurs rubs/gallops RESP: clear and equal to auscultation bilaterally in all lobes  Breast Exam:  ***Deferred with low risks and shared decision making, discussed recommendation to start mammogram between 40-50 yo/ exam performed: right breast normal without mass, skin or nipple changes or axillary nodes, left breast normal without mass, skin or nipple changes or axillary nodes Abdomen: soft Neuro: alert and oriented x 3 Skin: warm, dry Psych: affect normal Pelvic exam: ***Deferred/  Performed: Cervix pink, visually closed, without lesion, scant white creamy discharge, vaginal walls and external genitalia normal Bimanual exam: Cervix 0/long/high, firm, anterior, neg CMT, uterus nontender, nonenlarged, adnexa without tenderness, enlargement, or mass        Assessment/Plan:   There are no diagnoses linked to this encounter.     No follow-ups on file.   Sharen Counter, CNM 8:49 AM

## 2023-07-27 NOTE — Progress Notes (Signed)
Sonya Green here for [name] Injection. Patient received last dose on 05/05/23; patient is 12w since last injection--within window to receive next dose today. Injection administered without complication. Patient will return in 3 months for next injection between 10/13/23 and 10/27/23. Next annual visit is past due (was due June 2023; last well woman exam was on 05/04/21)--informed patient that she will need to schedule appointment in order to continue receiving contraception in the future. Patient verbalized understanding and will schedule appointment for annual.   Meryl Crutch, RN 07/28/23 at 1045

## 2023-07-28 ENCOUNTER — Ambulatory Visit (INDEPENDENT_AMBULATORY_CARE_PROVIDER_SITE_OTHER): Payer: BC Managed Care – PPO

## 2023-07-28 ENCOUNTER — Other Ambulatory Visit: Payer: Self-pay

## 2023-07-28 VITALS — BP 130/79 | HR 70 | Ht 67.0 in | Wt 153.0 lb

## 2023-07-28 DIAGNOSIS — Z3042 Encounter for surveillance of injectable contraceptive: Secondary | ICD-10-CM | POA: Diagnosis not present

## 2023-07-28 MED ORDER — MEDROXYPROGESTERONE ACETATE 150 MG/ML IM SUSY
150.0000 mg | PREFILLED_SYRINGE | Freq: Once | INTRAMUSCULAR | Status: AC
Start: 2023-07-28 — End: 2023-07-28
  Administered 2023-07-28: 150 mg via INTRAMUSCULAR

## 2023-10-07 ENCOUNTER — Encounter (HOSPITAL_BASED_OUTPATIENT_CLINIC_OR_DEPARTMENT_OTHER): Payer: Self-pay

## 2023-10-07 ENCOUNTER — Other Ambulatory Visit: Payer: Self-pay

## 2023-10-07 ENCOUNTER — Emergency Department (HOSPITAL_BASED_OUTPATIENT_CLINIC_OR_DEPARTMENT_OTHER)
Admission: EM | Admit: 2023-10-07 | Discharge: 2023-10-07 | Disposition: A | Discharge status: Home / Self Care | Payer: BC Managed Care – PPO | Attending: Emergency Medicine | Admitting: Emergency Medicine

## 2023-10-07 DIAGNOSIS — J069 Acute upper respiratory infection, unspecified: Secondary | ICD-10-CM | POA: Diagnosis not present

## 2023-10-07 DIAGNOSIS — Z20822 Contact with and (suspected) exposure to covid-19: Secondary | ICD-10-CM | POA: Insufficient documentation

## 2023-10-07 DIAGNOSIS — R0981 Nasal congestion: Secondary | ICD-10-CM | POA: Diagnosis not present

## 2023-10-07 LAB — RESP PANEL BY RT-PCR (RSV, FLU A&B, COVID)  RVPGX2
Influenza A by PCR: NEGATIVE
Influenza B by PCR: NEGATIVE
Resp Syncytial Virus by PCR: NEGATIVE
SARS Coronavirus 2 by RT PCR: NEGATIVE

## 2023-10-07 LAB — GROUP A STREP BY PCR: Group A Strep by PCR: NOT DETECTED

## 2023-10-07 MED ORDER — FLUTICASONE PROPIONATE 50 MCG/ACT NA SUSP: 2.0000 | Freq: Every day | NASAL | 0 refills | Status: DC

## 2023-10-07 MED ORDER — BENZONATATE 100 MG PO CAPS: 100.0000 mg | ORAL_CAPSULE | Freq: Three times a day (TID) | ORAL | 0 refills | Status: DC

## 2023-10-07 NOTE — ED Notes (Signed)
 RN reviewed discharge instructions with pt. Pt verbalized understanding and had no further questions. VSS upon discharge.  

## 2023-10-07 NOTE — ED Triage Notes (Signed)
She c/o congested cough with sore throat x 1 week. She is not sure if she has had a fever or not.

## 2023-10-07 NOTE — ED Provider Notes (Signed)
[place] EMERGENCY DEPARTMENT AT [facility] Provider Note   CSN: [id number] Arrival date & time: 10/07/23  1628     History  Chief Complaint  Patient presents with   URI   URI with sore throat    Sonya Green is a 32 y.o. female no significant past medical history presents today for congestion, cough, sore throat for approximately 1 week.  Patient denies fever, chills, abdominal pain, shortness of breath, headache, ear pain, nausea, vomiting, or diarrhea.  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   URI Presenting symptoms: congestion, cough, fatigue and sore throat        Home Medications Prior to Admission medications   Medication Sig Start Date End Date Taking? Authorizing Provider  benzonatate (TESSALON) 100 MG capsule Take 1 capsule (100 mg total) by mouth every 8 (eight) hours. 10/07/23  Yes Dolphus Jenny, PA-C  fluticasone (FLONASE) 50 MCG/ACT nasal spray Place 2 sprays into both nostrils daily. 10/07/23  Yes Dolphus Jenny, PA-C  Ascorbic Acid (VITAMIN C PO) Take by mouth.    [provider]  Multiple Vitamins-Minerals (MULTIPLE VITAMINS/WOMENS PO) Take by mouth.    [provider]      Allergies    Spinach, Sulfa antibiotics, and Zithromax [azithromycin]    Review of Systems   Review of Systems  Constitutional:  Positive for fatigue.  HENT:  Positive for congestion and sore throat.   Respiratory:  Positive for cough.     Physical Exam Updated Vital Signs BP (!) 140/87   Pulse 98   Temp 98.3 F (36.8 C)   Resp 16   SpO2 100%  Physical Exam Vitals and nursing note reviewed.  Constitutional:      General: She is not in acute distress.    Appearance: Normal appearance. She is well-developed.  HENT:     Head: Normocephalic and atraumatic.     Right Ear: External ear normal.     Left Ear: External ear normal.     Nose: Congestion present.     Mouth/Throat:     Mouth:  Mucous membranes are moist.     Pharynx: Uvula midline. Posterior oropharyngeal erythema present. No oropharyngeal exudate.     Tonsils: No tonsillar abscesses.  Eyes:     Conjunctiva/sclera: Conjunctivae normal.  Cardiovascular:     Rate and Rhythm: Normal rate and regular rhythm.     Heart sounds: No murmur heard. Pulmonary:     Effort: Pulmonary effort is normal. No respiratory distress.     Breath sounds: Normal breath sounds.  Abdominal:     General: Abdomen is flat.     Palpations: Abdomen is soft.     Tenderness: There is no abdominal tenderness.  Musculoskeletal:        General: No swelling.     Cervical back: Neck supple.  Skin:    General: Skin is warm and dry.     Capillary Refill: Capillary refill takes less than 2 seconds.  Neurological:     General: No focal deficit present.     Mental Status: She is alert.  Psychiatric:        Mood and Affect: Mood normal.     ED Results / Procedures / Treatments   Labs (all labs ordered are listed, but only abnormal results are displayed) Labs Reviewed  GROUP A STREP BY PCR  RESP PANEL BY RT-PCR (RSV, FLU A&B, COVID)  RVPGX2  EKG None  Radiology No results found.  Procedures Procedures    Medications Ordered in ED Medications - No data to display  ED Course/ Medical Decision Making/ A&P                                 Medical Decision Making  This patient presents to the ED with chief complaint(s) of cough, congestion, sore throat with pertinent past medical history of none which further complicates the presenting complaint. The complaint involves an extensive differential diagnosis and also carries with it a high risk of complications and morbidity.    The differential diagnosis includes URI, strep throat, COVID, flu, RSV  ED Course and Reassessment:   Independent labs interpretation:  The following labs were independently interpreted:  Respiratory panel: Negative Strep PCR:  Negative  Consultation: - Consulted or discussed management/test interpretation w/ external professional: None  Consideration for admission or further workup: Considered for admission or further workup however patient's vital signs, labs, and physical exam of all been reassuring.  Patient will have symptomatic management for upper respiratory symptoms.  Patient should follow-up with PCP if symptoms persist for further evaluation and treatment.        Final Clinical Impression(s) / ED Diagnoses Final diagnoses:  Upper respiratory tract infection, unspecified type    Rx / DC Orders ED Discharge Orders          Ordered    fluticasone (FLONASE) 50 MCG/ACT nasal spray  Daily        10/07/23 1830    benzonatate (TESSALON) 100 MG capsule  Every 8 hours        10/07/23 1830              Dolphus Jenny, PA-C 10/07/23 1832    Benjiman Core, MD 10/07/23 [phone]

## 2023-10-07 NOTE — Discharge Instructions (Addendum)
Today were seen for an upper respiratory infection.  Please pick up your medication and take as needed.  May also alternate taking Tylenol and Motrin as needed for pain.  Please do not take Motrin for greater than 5 days and arises may cause rebound headaches.  Thank you for letting us treat you today. After performing a physical exam and reviewing your labs, I feel you are safe to go home. Please follow up with your PCP in the next several days and provide them with your records from this visit. Return to the Emergency Room if pain becomes severe or symptoms worsen.

## 2023-10-20 ENCOUNTER — Ambulatory Visit: Payer: BC Managed Care – PPO

## 2023-10-20 ENCOUNTER — Other Ambulatory Visit: Payer: Self-pay

## 2023-11-10 ENCOUNTER — Ambulatory Visit: Payer: BC Managed Care – PPO | Admitting: Obstetrics and Gynecology

## 2023-11-16 ENCOUNTER — Ambulatory Visit: Payer: BC Managed Care – PPO | Admitting: Family Medicine

## 2023-12-12 ENCOUNTER — Encounter: Payer: Self-pay | Admitting: Obstetrics and Gynecology

## 2023-12-12 ENCOUNTER — Ambulatory Visit: Payer: BC Managed Care – PPO | Admitting: Obstetrics and Gynecology

## 2023-12-12 ENCOUNTER — Other Ambulatory Visit: Payer: Self-pay

## 2023-12-12 VITALS — BP 117/79 | HR 102 | Wt 158.5 lb

## 2023-12-12 DIAGNOSIS — Z3202 Encounter for pregnancy test, result negative: Secondary | ICD-10-CM

## 2023-12-12 DIAGNOSIS — Z3042 Encounter for surveillance of injectable contraceptive: Secondary | ICD-10-CM

## 2023-12-12 DIAGNOSIS — F4321 Adjustment disorder with depressed mood: Secondary | ICD-10-CM | POA: Diagnosis not present

## 2023-12-12 LAB — POCT PREGNANCY, URINE: Preg Test, Ur: NEGATIVE

## 2023-12-12 MED ORDER — MEDROXYPROGESTERONE ACETATE 150 MG/ML IM SUSY
150.0000 mg | PREFILLED_SYRINGE | Freq: Once | INTRAMUSCULAR | Status: AC
Start: 2023-12-12 — End: 2023-12-12
  Administered 2023-12-12: 150 mg via INTRAMUSCULAR

## 2023-12-12 NOTE — Progress Notes (Signed)
ANNUAL EXAM Patient name: Sonya Green MRN [id number]  Date of birth: 05-08-1991 Chief Complaint:   Gynecologic Exam  History of Present Illness:   Sonya Green is a 33 y.o. G73P0101  female being seen today for a routine annual exam.  Current complaints: Previously on depo, would like to continue. Out of depo window, no unprotected sex in two weeks No vaginal or pelvic complaints. Reports being overwhelmed, nervous/anxious today. She reports she is going through a lot right now. She lost a family member and lost her job.   No LMP recorded. Patient has had an injection.   The pregnancy intention screening data noted above was reviewed. Potential methods of contraception were discussed. The patient elected to proceed with Depo  Last pap 2022. Results were:  NILM . H/O abnormal pap: yes 2021 LSIL Last mammogram: n/a. Results were: N/A. Family h/o breast cancer: no      05/04/2021    3:27 PM 04/13/2021    2:15 PM 01/26/2021    5:43 PM 11/04/2020    2:03 PM 08/14/2020    8:25 AM  Depression screen PHQ 2/9  Decreased Interest 0 0 0 0 0  Down, Depressed, Hopeless 0 0 0 0 0  PHQ - 2 Score 0 0 0 0 0  Altered sleeping 0 0 0 0 0  Tired, decreased energy 0 0 0 0 0  Change in appetite 0 0 0 0 0  Feeling bad or failure about yourself  0 0 0 0 0  Trouble concentrating 0 0 0 0 0  Moving slowly or fidgety/restless 0 0 0 0 0  Suicidal thoughts 0 0 0 0 0  PHQ-9 Score 0 0 0 0 0  Difficult doing work/chores  Not difficult at all           05/04/2021    3:27 PM 04/13/2021    2:15 PM 01/26/2021    5:43 PM 08/14/2020    8:26 AM  GAD 7 : Generalized Anxiety Score  Nervous, Anxious, on Edge 1 0 0 0  Control/stop worrying 1 0 0 0  Worry too much - different things 1 0 0 0  Trouble relaxing  0 0 0  Restless 0 0 0   Easily annoyed or irritable 0 0 0 0  Afraid - awful might happen 0 0 0 0  Total GAD 7 Score  0 0      Review of Systems:   Pertinent items are noted in HPI Denies any  headaches, blurred vision, fatigue, shortness of breath, chest pain, abdominal pain, abnormal vaginal discharge/itching/odor/irritation, problems with periods, bowel movements, urination, or intercourse unless otherwise stated above. Pertinent History Reviewed:  Reviewed past medical,surgical, social and family history.  Reviewed problem list, medications and allergies. Physical Assessment:   Vitals:   12/12/23 1602  BP: 117/79  Pulse: (!) 102  Weight: 158 lb 8 oz (71.9 kg)  Body mass index is 24.82 kg/m.        Physical Examination:   General appearance - well appearing, and in no distress  Mental status - alert, oriented  Psych:  anxious  Skin - warm and dry, normal color  Chest - effort normal  Heart - normal rate   Neck:  midline trachea  Breasts - deferred  Abdomen - soft,  Pelvic - deferred  Extremities:  No swelling or varicosities noted  Chaperone present for exam  No results found for this or any previous visit (from the past 24 hours).  Assessment &  Plan:   1. Encounter for surveillance of injectable contraceptive (Primary) UPT negative, depo given today  - medroxyPROGESTERone Acetate SUSY 150 mg  2. Grief Deferred pap and exam today  Discussed options for grief, would like counseling, referral placed today  - Amb ref to Integrated Behavioral Health  Labs/procedures today:   Mammogram: @ 33yo, or sooner if problems   Orders Placed This Encounter  Procedures   Amb ref to Integrated Behavioral Health   Pregnancy, urine POC    Meds:  Meds ordered this encounter  Medications   medroxyPROGESTERone Acetate SUSY 150 mg    Follow-up: Return in 1 month with [facility] for annual with pap, nurse visit depo in 3 months, schedule with Asher Muir  Future Appointments  Date Time Provider Department Center  12/28/2023  1:15 PM [facility] HEALTH CLINICIAN [facility]  01/23/2024  3:35 PM Sue Lush, FNP [facility]  02/28/2024  2:00 PM [facility] NURSE [facility]   Albertine Grates, FNP

## 2023-12-14 NOTE — BH Specialist Note (Deleted)
 Integrated Behavioral Health via Telemedicine Visit  12/14/2023 Sonya Green [id number]  Number of Integrated Behavioral Health Clinician visits: No data recorded Session Start time: No data recorded  Session End time: No data recorded Total time in minutes: No data recorded  Referring Provider: Albertine Grates, FNP Patient/Family location: Home*** [facility] Provider location: [facility]  All persons participating in visit: Patient Sonya Green and [facility] [name] ***  Types of Service: {CHL AMB TYPE OF SERVICE:[phone]}  I connected with [name] and/or [name] {family members:20773} via  Telephone or Video Enabled Telemedicine Application  (Video is Caregility application) and verified that I am speaking with the correct person using two identifiers. Discussed confidentiality: Yes   I discussed the limitations of telemedicine and the availability of in person appointments.  Discussed there is a possibility of technology failure and discussed alternative modes of communication if that failure occurs.  I discussed that engaging in this telemedicine visit, they consent to the provision of behavioral healthcare and the services will be billed under their insurance.  Patient and/or legal guardian expressed understanding and consented to Telemedicine visit: Yes   Presenting Concerns: Patient and/or family reports the following symptoms/concerns: *** Duration of problem: ***; Severity of problem: {Mild/Moderate/Severe:20260}  Patient and/or Family's Strengths/Protective Factors: {CHL AMB BH PROTECTIVE FACTORS:[phone]}  Goals Addressed: Patient will:  Reduce symptoms of: {IBH Symptoms:21014056}   Increase knowledge and/or ability of: {IBH Patient Tools:21014057}   Demonstrate ability to: {IBH Goals:21014053}  Progress towards Goals: {CHL AMB BH PROGRESS TOWARDS  GOALS:[id number]}  Interventions: Interventions utilized:  {IBH Interventions:21014054} Standardized Assessments completed: {IBH Screening Tools:21014051}  Patient and/or Family Response: Patient agrees with treatment plan. ***  Assessment: Patient currently experiencing ***.   Patient may benefit from psychoeducation and brief therapeutic interventions regarding coping with symptoms of *** .  Plan: Follow up with behavioral health clinician on : *** Behavioral recommendations:  -*** -*** Referral(s): {IBH Referrals:21014055}  I discussed the assessment and treatment plan with the patient and/or parent/guardian. They were provided an opportunity to ask questions and all were answered. They agreed with the plan and demonstrated an understanding of the instructions.   They were advised to call back or seek an in-person evaluation if the symptoms worsen or if the condition fails to improve as anticipated.  Valetta Close Ysabel Cowgill, LCSW     05/04/2021    3:27 PM 04/13/2021    2:15 PM 01/26/2021    5:43 PM 11/04/2020    2:03 PM 08/14/2020    8:25 AM  Depression screen PHQ 2/9  Decreased Interest 0 0 0 0 0  Down, Depressed, Hopeless 0 0 0 0 0  PHQ - 2 Score 0 0 0 0 0  Altered sleeping 0 0 0 0 0  Tired, decreased energy 0 0 0 0 0  Change in appetite 0 0 0 0 0  Feeling bad or failure about yourself  0 0 0 0 0  Trouble concentrating 0 0 0 0 0  Moving slowly or fidgety/restless 0 0 0 0 0  Suicidal thoughts 0 0 0 0 0  PHQ-9 Score 0 0 0 0 0  Difficult doing work/chores  Not difficult at all         05/04/2021    3:27 PM 04/13/2021    2:15 PM 01/26/2021    5:43 PM 08/14/2020    8:26 AM  GAD 7 : Generalized Anxiety Score  Nervous, Anxious, on Edge 1 0 0  0  Control/stop worrying 1 0 0 0  Worry too much - different things 1 0 0 0  Trouble relaxing  0 0 0  Restless 0 0 0   Easily annoyed or irritable 0 0 0 0  Afraid - awful might happen 0 0 0 0  Total GAD 7 Score  0 0

## 2023-12-27 ENCOUNTER — Telehealth: Payer: Self-pay | Admitting: Family Medicine

## 2023-12-27 NOTE — Telephone Encounter (Signed)
 Left message of office closed at 12 because of weather.

## 2023-12-29 NOTE — BH Specialist Note (Unsigned)
 Integrated Behavioral Health via Telemedicine Visit  01/12/2024 Marranda Arakelian [id number]  Number of Integrated Behavioral Health Clinician visits: 1- Initial Visit  Session Start time: 1330   Session End time: 1345  Total time in minutes: 15   Referring Provider: Albertine Grates, FNP Patient/Family location: Home [facility] Provider location: [facility]  All persons participating in visit: Patient Sonya Green and [facility] Markela Wee   Types of Service: Individual psychotherapy and Video visit  I connected with Seda Putzier and/or Dusty Raider's  n/a  via  Telephone or Video Enabled Telemedicine Application  (Video is Caregility application) and verified that I am speaking with the correct person using two identifiers. Discussed confidentiality: Yes   I discussed the limitations of telemedicine and the availability of in person appointments.  Discussed there is a possibility of technology failure and discussed alternative modes of communication if that failure occurs.  I discussed that engaging in this telemedicine visit, they consent to the provision of behavioral healthcare and the services will be billed under their insurance.  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  Duration of problem: Over one month  Patient and/or Family's Strengths/Protective Factors: Social connections, Concrete supports in place (healthy food, safe environments, etc.), and Sense of  purpose  Goals Addressed: Patient will:  Reduce symptoms of: stress   Increase knowledge and/or ability of: stress reduction   Demonstrate ability to: Increase healthy adjustment to current life circumstances and Increase adequate support systems for patient/family  Progress towards Goals: Ongoing  Interventions: Interventions utilized:  Link to [employer] and Supportive Reflection Standardized Assessments completed: Not Needed  Patient and/or Family Response: Patient agrees with treatment plan.   Assessment: Patient currently experiencing Grief and Psychosocial stress.   Patient may benefit from psychoeducation and brief therapeutic interventions regarding coping with grief and life stress .  Plan: Follow up with behavioral health clinician on : Call Eldene Plocher at [phone], as needed. Behavioral recommendations:  -Continue prioritizing healthy self-care (regular meals, adequate rest; time with supportive friends and family)  -Read through information/resources on After Visit Summary; use as needed and discussed  Referral(s): Integrated Art gallery manager (In Clinic) and [employer] Resources:  Grief support; Business; [place] eligibility; Family autism support  I discussed the assessment and treatment plan with the patient and/or parent/guardian. They were provided an opportunity to ask questions and all were answered. They agreed with the plan and demonstrated an understanding of the instructions.   They were advised to call back or seek an in-person evaluation if the symptoms worsen or if the condition fails to improve as anticipated.  Valetta Close Rual Vermeer, LCSW     12/12/2023    4:59 PM 05/04/2021    3:27 PM 04/13/2021    2:15 PM 01/26/2021    5:43 PM 11/04/2020    2:03 PM  Depression screen PHQ 2/9  Decreased Interest 0 0 0 0 0  Down, Depressed, Hopeless 0 0 0 0 0  PHQ - 2 Score 0 0 0 0 0  Altered sleeping 0 0 0 0  0  Tired, decreased energy 0 0 0 0 0   Change in appetite 0 0 0 0 0  Feeling bad or failure about yourself  0 0 0 0 0  Trouble concentrating 0 0 0 0 0  Moving slowly or fidgety/restless 0 0 0 0 0  Suicidal thoughts 0 0 0 0 0  PHQ-9 Score 0 0 0 0 0  Difficult doing work/chores   Not difficult at all        12/12/2023    4:59 PM 05/04/2021    3:27 PM 04/13/2021    2:15 PM 01/26/2021    5:43 PM  GAD 7 : Generalized Anxiety Score  Nervous, Anxious, on Edge 0 1 0 0  Control/stop worrying 0 1 0 0  Worry too much - different things 0 1 0 0  Trouble relaxing 0  0 0  Restless 0 0 0 0  Easily annoyed or irritable 0 0 0 0  Afraid - awful might happen 0 0 0 0  Total GAD 7 Score 0  0 0

## 2024-01-12 ENCOUNTER — Ambulatory Visit: Payer: Self-pay | Admitting: Clinical

## 2024-01-12 DIAGNOSIS — F4321 Adjustment disorder with depressed mood: Secondary | ICD-10-CM

## 2024-01-12 DIAGNOSIS — Z658 Other specified problems related to psychosocial circumstances: Secondary | ICD-10-CM

## 2024-01-12 NOTE — Patient Instructions (Addendum)
 Center for [facility] Healthcare at [facility] for [facility] [address] [place], [place] [address] [phone] (main office) [phone] (Haru Anspaugh's office)  [facility] (Individual and group grief support) [facility]org  [phone]  SCORE: For the life of your business www.score.org   [employer] www.womenscentergso.org   Medicaid Eligibility https://medicaid.http://tanner-lang.biz/                      [facility] Autism Resources:   [employer] Schools: Autism Resources http://www1.SouvenirBaseball.es.htm  Partnership for Children [facility] https://www.[place].org/families/developmental-needs/  [employer] Centers PreviewDomains.se  [employer] https://triadmomsonmain.com/my-blog/special-needs-resources/  Autism Society https://www.autismsociety-[place].org/find-help/  Embrace Autism embrace-autism.com

## 2024-01-23 ENCOUNTER — Ambulatory Visit: Payer: Self-pay | Admitting: Obstetrics and Gynecology

## 2024-02-26 ENCOUNTER — Encounter (HOSPITAL_BASED_OUTPATIENT_CLINIC_OR_DEPARTMENT_OTHER): Payer: Self-pay | Admitting: Emergency Medicine

## 2024-02-26 ENCOUNTER — Emergency Department (HOSPITAL_BASED_OUTPATIENT_CLINIC_OR_DEPARTMENT_OTHER)
Admission: EM | Admit: 2024-02-26 | Discharge: 2024-02-26 | Disposition: A | Discharge status: Home / Self Care | Payer: Self-pay | Attending: Emergency Medicine | Admitting: Emergency Medicine

## 2024-02-26 ENCOUNTER — Emergency Department (HOSPITAL_BASED_OUTPATIENT_CLINIC_OR_DEPARTMENT_OTHER): Payer: Self-pay | Admitting: Radiology

## 2024-02-26 ENCOUNTER — Other Ambulatory Visit: Payer: Self-pay

## 2024-02-26 DIAGNOSIS — I10 Essential (primary) hypertension: Secondary | ICD-10-CM | POA: Insufficient documentation

## 2024-02-26 DIAGNOSIS — B349 Viral infection, unspecified: Secondary | ICD-10-CM | POA: Diagnosis not present

## 2024-02-26 DIAGNOSIS — R0789 Other chest pain: Secondary | ICD-10-CM | POA: Diagnosis not present

## 2024-02-26 LAB — RESP PANEL BY RT-PCR (RSV, FLU A&B, COVID)  RVPGX2
Influenza A by PCR: NEGATIVE
Influenza B by PCR: NEGATIVE
Resp Syncytial Virus by PCR: NEGATIVE
SARS Coronavirus 2 by RT PCR: NEGATIVE

## 2024-02-26 MED ORDER — ACETAMINOPHEN 500 MG PO TABS
1000.0000 mg | ORAL_TABLET | Freq: Once | ORAL | Status: AC
  Administered 2024-02-26: 1000 mg via ORAL
  Filled 2024-02-26: qty 2

## 2024-02-26 NOTE — Discharge Instructions (Signed)
 Your symptom is likely due to a viral illness.  Please take over-the-counter Tylenol  or ibuprofen  as needed for aches and pain.  Stay hydrated, get plenty of rest, return if you have any concern.

## 2024-02-26 NOTE — ED Provider Notes (Signed)
 [place] EMERGENCY DEPARTMENT AT [facility] Provider Note   CSN: [id number] Arrival date & time: 02/26/24  2007     History  Chief Complaint  Patient presents with   Chills    Sonya Green is a 33 y.o. female.  The history is provided by the patient and medical records. No language interpreter was used.     33 year old female history of hypertension, migraine, presenting with cold symptoms.  Patient report for the past 4 days she has had chills, body aches, congestion, runny nose, sore throat, cough and now having chest pain.  States most of her symptoms has improved however she still endorsed some cough and chest tightness.  She does not endorse any nausea vomiting diarrhea no fever no rash no urinary symptoms.  She tries over-the-counter medication at home with some relief.  No history of PE or DVT.  No recent surgery or prolonged bedrest.  She is exposed to roommates that use tobacco product.  She endorsed recent sick contact at work.  Home Medications Prior to Admission medications   Medication Sig Start Date End Date Taking? Authorizing Provider  Ascorbic Acid (VITAMIN C PO) Take by mouth.    [provider]  Multiple Vitamins-Minerals (MULTIPLE VITAMINS/WOMENS PO) Take by mouth.    [provider]      Allergies    Spinach, Sulfa antibiotics, and Zithromax [azithromycin]    Review of Systems   Review of Systems  All other systems reviewed and are negative.   Physical Exam Updated Vital Signs BP (!) 128/97 (BP Location: Left Arm)   Pulse 89   Temp 98.4 F (36.9 C) (Oral)   Resp 16   SpO2 100%  Physical Exam Vitals and nursing note reviewed.  Constitutional:      General: She is not in acute distress.    Appearance: She is well-developed.  HENT:     Head: Atraumatic.     Right Ear: Tympanic membrane normal.     Left Ear: Tympanic membrane normal.     Nose: Nose normal.     Mouth/Throat:     Mouth: Mucous membranes are moist.      Pharynx: No oropharyngeal exudate or posterior oropharyngeal erythema.  Eyes:     Conjunctiva/sclera: Conjunctivae normal.  Cardiovascular:     Rate and Rhythm: Normal rate and regular rhythm.     Pulses: Normal pulses.     Heart sounds: Normal heart sounds.  Pulmonary:     Effort: Pulmonary effort is normal.     Breath sounds: No wheezing, rhonchi or rales.  Abdominal:     Palpations: Abdomen is soft.     Tenderness: There is no abdominal tenderness.  Musculoskeletal:     Cervical back: Normal range of motion and neck supple. No rigidity or tenderness.  Skin:    Findings: No rash.  Neurological:     Mental Status: She is alert.  Psychiatric:        Mood and Affect: Mood normal.     ED Results / Procedures / Treatments   Labs (all labs ordered are listed, but only abnormal results are displayed) Labs Reviewed  RESP PANEL BY RT-PCR (RSV, FLU A&B, COVID)  [id number]    EKG EKG Interpretation Date/Time:  Sunday February 26 2024 20:15:24 EDT Ventricular Rate:  86 PR Interval:  142 QRS Duration:  99 QT Interval:  355 QTC Calculation: 425 R Axis:   75  Text Interpretation: Sinus rhythm No significant change since prior 4/24 Confirmed  by Racheal Buddle [phone]) on 02/26/2024 8:17:26 PM  Radiology DG Chest 2 View Result Date: 02/26/2024 CLINICAL DATA:  Cough, chills, runny nose, body aches EXAM: CHEST - 2 VIEW COMPARISON:  02/15/2023 FINDINGS: The heart size and mediastinal contours are within normal limits. Both lungs are clear. The visualized skeletal structures are unremarkable. IMPRESSION: No active cardiopulmonary disease. Electronically Signed   By: Rozell Cornet M.D.   On: 02/26/2024 21:43    Procedures Procedures    Medications Ordered in ED Medications  acetaminophen  (TYLENOL ) tablet 1,000 mg (1,000 mg Oral Given 02/26/24 2054)    ED Course/ Medical Decision Making/ A&P                                 Medical Decision Making Amount and/or Complexity of  Data Reviewed Radiology: ordered.  Risk OTC drugs.   BP (!) 128/97 (BP Location: Left Arm)   Pulse 89   Temp 98.4 F (36.9 C) (Oral)   Resp 16   SpO2 100%   49:69 PM  33 year old female history of hypertension, migraine, presenting with cold symptoms.  Patient report for the past 4 days she has had chills, body aches, congestion, runny nose, sore throat, cough and now having chest pain.  States most of her symptoms has improved however she still endorsed some cough and chest tightness.  She does not endorse any nausea vomiting diarrhea no fever no rash no urinary symptoms.  She tries over-the-counter medication at home with some relief.  No history of PE or DVT.  No recent surgery or prolonged bedrest.  She is exposed to roommates that use tobacco product.  On exam this is a well-appearing female resting comfortably appears to be in no acute discomfort.  Ear nose and throat exam unremarkable.  Normal phonation.  Heart with normal rate rhythm, lungs clear abdomen soft nontender.  Vital signs are reassuring.  -Labs ordered, independently viewed and interpreted by me.  Labs remarkable for negative respiratory panel -The patient was maintained on a cardiac monitor.  I personally viewed and interpreted the cardiac monitored which showed an underlying rhythm of: Normal sinus rhythm -Imaging independently viewed and interpreted by me and I agree with radiologist's interpretation.  Result remarkable for chest x-ray unremarkable -This patient presents to the ED for concern of cold sxs.   , this involves an extensive number of treatment options, and is a complaint that carries with it a high risk of complications and morbidity.  The differential diagnosis includes covid, flu, rsv, pneumonia, viral illness -Co morbidities that complicate the patient evaluation includes HTN, migraine -Treatment includes tylenol  -Reevaluation of the patient after these medicines showed that the patient improved -PCP  office notes or outside notes reviewed -Escalation to admission/observation considered: patients feels much better, is comfortable with discharge, and will follow up with PCP -Prescription medication considered, patient comfortable with OTC tylenol /ibuprofen  -Social Determinant of Health considered which includes hx of tobacco use         Final Clinical Impression(s) / ED Diagnoses Final diagnoses:  Viral illness    Rx / DC Orders ED Discharge Orders     None         Debbra Fairy, PA-C 02/26/24 2228    Tonya Fredrickson, MD 02/27/24 1609

## 2024-02-26 NOTE — ED Triage Notes (Signed)
 Chills, runny nose, congestion body aches Chest pain x 4 days

## 2024-02-28 ENCOUNTER — Other Ambulatory Visit: Payer: Self-pay

## 2024-02-28 ENCOUNTER — Ambulatory Visit: Payer: Self-pay

## 2024-04-09 ENCOUNTER — Other Ambulatory Visit: Payer: Self-pay

## 2024-04-09 ENCOUNTER — Encounter (HOSPITAL_BASED_OUTPATIENT_CLINIC_OR_DEPARTMENT_OTHER): Payer: Self-pay

## 2024-04-09 ENCOUNTER — Emergency Department (HOSPITAL_BASED_OUTPATIENT_CLINIC_OR_DEPARTMENT_OTHER)
Admission: EM | Admit: 2024-04-09 | Discharge: 2024-04-09 | Disposition: A | Discharge status: Home / Self Care | Payer: Self-pay | Attending: Emergency Medicine | Admitting: Emergency Medicine

## 2024-04-09 DIAGNOSIS — J020 Streptococcal pharyngitis: Secondary | ICD-10-CM | POA: Insufficient documentation

## 2024-04-09 LAB — GROUP A STREP BY PCR: Group A Strep by PCR: DETECTED — AB

## 2024-04-09 MED ORDER — DEXAMETHASONE 4 MG PO TABS
10.0000 mg | ORAL_TABLET | Freq: Once | ORAL | Status: AC
  Administered 2024-04-09: 10 mg via ORAL
  Filled 2024-04-09: qty 3

## 2024-04-09 MED ORDER — PENICILLIN G BENZATHINE 1200000 UNIT/2ML IM SUSY
1.2000 10*6.[IU] | PREFILLED_SYRINGE | Freq: Once | INTRAMUSCULAR | Status: AC
  Administered 2024-04-09: 1.2 10*6.[IU] via INTRAMUSCULAR
  Filled 2024-04-09: qty 2

## 2024-04-09 NOTE — ED Triage Notes (Signed)
 Pt reports sore throat x2 days. Pt reports dry throat at night and coughing up mucous in the morning.

## 2024-04-09 NOTE — Discharge Instructions (Signed)
 Drink plenty of fluids.  Take acetaminophen  and/or ibuprofen  as needed for pain.  Use lozenges, throat sprays as needed.

## 2024-04-09 NOTE — ED Provider Notes (Signed)
 [place] EMERGENCY DEPARTMENT AT [facility] Provider Note   CSN: [id number] Arrival date & time: 04/09/24  2055     History  Chief Complaint  Patient presents with   Sore Throat    Sonya Green is a 33 y.o. female.  The history is provided by the patient.  Sore Throat  She had onset yesterday of sore throat which got worse today.  She denies fever, chills, sweats.  There has been a cough productive of small amount of clear to yellow sputum.  She does endorse some bodyaches.  She denies nausea or vomiting.  She has had no known sick contacts.   Home Medications Prior to Admission medications   Medication Sig Start Date End Date Taking? Authorizing Provider  Ascorbic Acid (VITAMIN C PO) Take by mouth.    [provider]  Multiple Vitamins-Minerals (MULTIPLE VITAMINS/WOMENS PO) Take by mouth.    [provider]      Allergies    Spinach, Sulfa antibiotics, and Zithromax [azithromycin]    Review of Systems   Review of Systems  All other systems reviewed and are negative.   Physical Exam Updated Vital Signs BP (!) 145/97 (BP Location: Right Arm)   Pulse 98   Temp 98.4 F (36.9 C) (Oral)   Resp 18   Ht 5\' 7"  (1.702 m)   Wt 63.5 kg   SpO2 100%   BMI 21.93 kg/m  Physical Exam Vitals and nursing note reviewed.   33 year old female, resting comfortably and in no acute distress. Vital signs are significant for slightly elevated blood pressure. Oxygen saturation is 100%, which is normal. Head is normocephalic and atraumatic. PERRLA, EOMI. Oropharynx is clear - no tonsillar erythema or hypertrophy and no exudate seen. Neck is nontender and supple.  There are several palpable anterior and posterior cervical lymph nodes bilaterally. Lungs are clear without rales, wheezes, or rhonchi. Chest is nontender. Heart has regular rate and rhythm without murmur. Abdomen is soft, flat, nontender. Skin is warm and dry without rash. Neurologic: Awake  and alert, moves all extremities equally.  ED Results / Procedures / Treatments   Labs (all labs ordered are listed, but only abnormal results are displayed) Labs Reviewed  GROUP A STREP BY PCR - Abnormal; Notable for the following components:      Result Value   Group A Strep by PCR DETECTED (*)    All other components within normal limits   Procedures Procedures    Medications Ordered in ED Medications  dexamethasone  (DECADRON ) tablet 10 mg (has no administration in time range)  penicillin g benzathine (BICILLIN LA) 1200000 UNIT/2ML injection 1.2 Million Units (has no administration in time range)    ED Course/ Medical Decision Making/ A&P                                 Medical Decision Making Risk Prescription drug management.   Pharyngitis-viral versus streptococcal.  I reviewed her laboratory tests, my interpretation is positive PCR for group A strep.  She has streptococcal pharyngitis.  I have offered patient option of 10 days of oral antibiotics versus injection of penicillin and patient has opted for penicillin injection.  I have ordered an injection of Bicillin LA and also a dose of oral dexamethasone .  Final Clinical Impression(s) / ED Diagnoses Final diagnoses:  Streptococcal pharyngitis    Rx / DC Orders ED Discharge Orders     None  [name], MD 04/09/24 [phone]

## 2024-05-06 ENCOUNTER — Emergency Department (HOSPITAL_BASED_OUTPATIENT_CLINIC_OR_DEPARTMENT_OTHER): Payer: Self-pay

## 2024-05-06 ENCOUNTER — Other Ambulatory Visit: Payer: Self-pay

## 2024-05-06 ENCOUNTER — Emergency Department (HOSPITAL_BASED_OUTPATIENT_CLINIC_OR_DEPARTMENT_OTHER)
Admission: EM | Admit: 2024-05-06 | Discharge: 2024-05-06 | Disposition: A | Discharge status: Home / Self Care | Payer: Self-pay | Attending: Emergency Medicine | Admitting: Emergency Medicine

## 2024-05-06 DIAGNOSIS — R350 Frequency of micturition: Secondary | ICD-10-CM | POA: Insufficient documentation

## 2024-05-06 DIAGNOSIS — R8271 Bacteriuria: Secondary | ICD-10-CM | POA: Insufficient documentation

## 2024-05-06 DIAGNOSIS — R300 Dysuria: Secondary | ICD-10-CM | POA: Insufficient documentation

## 2024-05-06 DIAGNOSIS — R102 Pelvic and perineal pain: Secondary | ICD-10-CM

## 2024-05-06 DIAGNOSIS — R1030 Lower abdominal pain, unspecified: Secondary | ICD-10-CM | POA: Insufficient documentation

## 2024-05-06 LAB — CBC
HCT: 38.5 % (ref 36.0–46.0)
Hemoglobin: 13.7 g/dL (ref 12.0–15.0)
MCH: 30.9 pg (ref 26.0–34.0)
MCHC: 35.6 g/dL (ref 30.0–36.0)
MCV: 86.7 fL (ref 80.0–100.0)
Platelets: 239 10*3/uL (ref 150–400)
RBC: 4.44 MIL/uL (ref 3.87–5.11)
RDW: 11.9 % (ref 11.5–15.5)
WBC: 4.2 10*3/uL (ref 4.0–10.5)
nRBC: 0 % (ref 0.0–0.2)

## 2024-05-06 LAB — URINALYSIS, W/ REFLEX TO CULTURE (INFECTION SUSPECTED)
Bilirubin Urine: NEGATIVE
Glucose, UA: NEGATIVE mg/dL
Ketones, ur: NEGATIVE mg/dL
Nitrite: NEGATIVE
Protein, ur: NEGATIVE mg/dL
Specific Gravity, Urine: 1.007 (ref 1.005–1.030)
pH: 6 (ref 5.0–8.0)

## 2024-05-06 LAB — COMPREHENSIVE METABOLIC PANEL WITH GFR
ALT: 24 U/L (ref 0–44)
AST: 20 U/L (ref 15–41)
Albumin: 4.4 g/dL (ref 3.5–5.0)
Alkaline Phosphatase: 56 U/L (ref 38–126)
Anion gap: 9 (ref 5–15)
BUN: 7 mg/dL (ref 6–20)
CO2: 25 mmol/L (ref 22–32)
Calcium: 9.6 mg/dL (ref 8.9–10.3)
Chloride: 103 mmol/L (ref 98–111)
Creatinine, Ser: 0.79 mg/dL (ref 0.44–1.00)
GFR, Estimated: >60 mL/min (ref >60)
Glucose, Bld: 111 mg/dL — ABNORMAL HIGH (ref 70–99)
Potassium: 3.3 mmol/L — ABNORMAL LOW (ref 3.5–5.1)
Sodium: 136 mmol/L (ref 135–145)
Total Bilirubin: 0.6 mg/dL (ref 0.0–1.2)
Total Protein: 7 g/dL (ref 6.5–8.1)

## 2024-05-06 LAB — PREGNANCY, URINE: Preg Test, Ur: NEGATIVE

## 2024-05-06 LAB — LIPASE, BLOOD: Lipase: 49 U/L (ref 11–51)

## 2024-05-06 MED ORDER — KETOROLAC TROMETHAMINE 30 MG/ML IJ SOLN
30.0000 mg | Freq: Once | INTRAMUSCULAR | Status: AC
  Administered 2024-05-06: 30 mg via INTRAVENOUS
  Filled 2024-05-06: qty 1

## 2024-05-06 MED ORDER — CEFADROXIL 500 MG PO CAPS: 500.0000 mg | ORAL_CAPSULE | Freq: Two times a day (BID) | ORAL | 0 refills | Status: AC

## 2024-05-06 MED ORDER — PHENAZOPYRIDINE HCL 95 MG PO TABS: 95.0000 mg | ORAL_TABLET | Freq: Three times a day (TID) | ORAL | 0 refills | Status: DC | PRN

## 2024-05-06 MED ORDER — NAPROXEN 500 MG PO TABS: 500.0000 mg | ORAL_TABLET | Freq: Two times a day (BID) | ORAL | 0 refills | Status: DC

## 2024-05-06 NOTE — ED Notes (Signed)
Transport to ultrasound

## 2024-05-06 NOTE — ED Triage Notes (Signed)
 Pt POV reporting lower abd pain that began a couple hours ago, worse when lying down. Pt concerned for pregnancy, currently on Depo. Denies n/v/d.

## 2024-05-06 NOTE — ED Provider Notes (Signed)
 [place] EMERGENCY DEPARTMENT AT [facility] Provider Note   CSN: [id number] Arrival date & time: 05/06/24  1648    Patient presents with: Abdominal Pain   Sonya Green is a 33 y.o. female here for evaluation of suprapubic pain.  Started earlier today.  No discharge, concern for any STIs.  She does not get her menstrual cycle as she is on the Depo shot.  She was concerned for pregnancy.  She has had some urinary frequency dysuria.  No fever, nausea, vomiting, no changes in bowel movements.  Pain does not go to her left lower quadrant or right lower quadrant.  She is able to urinate and fully empty her bladder.  No hematuria.   HPI     Prior to Admission medications   Medication Sig Start Date End Date Taking? Authorizing Provider  cefadroxil (DURICEF) 500 MG capsule Take 1 capsule (500 mg total) by mouth 2 (two) times daily for 5 days. 05/06/24 05/11/24 Yes Brisia Schuermann A, PA-C  naproxen (NAPROSYN) 500 MG tablet Take 1 tablet (500 mg total) by mouth 2 (two) times daily. 05/06/24  Yes Joyia Riehle A, PA-C  phenazopyridine (PYRIDIUM) 95 MG tablet Take 1 tablet (95 mg total) by mouth 3 (three) times daily as needed for pain. 05/06/24  Yes Nakyah Erdmann A, PA-C  Ascorbic Acid (VITAMIN C PO) Take by mouth.    [provider]  Multiple Vitamins-Minerals (MULTIPLE VITAMINS/WOMENS PO) Take by mouth.    [provider]    Allergies: Spinach, Sulfa antibiotics, and Zithromax [azithromycin]    Review of Systems  Constitutional: Negative.   HENT: Negative.    Respiratory: Negative.    Cardiovascular: Negative.   Gastrointestinal:  Positive for abdominal pain. Negative for abdominal distention, constipation, diarrhea, nausea and vomiting.  Genitourinary:  Positive for frequency, pelvic pain and urgency. Negative for decreased urine volume, difficulty urinating, dysuria, enuresis, flank pain, hematuria, menstrual problem and vaginal pain.  Neurological:  Negative.   All other systems reviewed and are negative.   Updated Vital Signs BP 122/66   Pulse 99   Temp 98.9 F (37.2 C) (Oral)   Resp 17   Ht 5' 7 (1.702 m)   Wt 65.8 kg   SpO2 100%   BMI 22.71 kg/m   Physical Exam Vitals and nursing note reviewed.  Constitutional:      General: She is not in acute distress.    Appearance: She is well-developed. She is not ill-appearing.  HENT:     Head: Atraumatic.   Eyes:     Pupils: Pupils are equal, round, and reactive to light.    Cardiovascular:     Rate and Rhythm: Normal rate.  Pulmonary:     Effort: No respiratory distress.  Abdominal:     General: Bowel sounds are normal. There is no distension.     Palpations: Abdomen is soft.     Tenderness: There is abdominal tenderness in the suprapubic area. There is no right CVA tenderness, left CVA tenderness, guarding or rebound. Negative signs include Murphy's sign and McBurney's sign.  Genitourinary:    Comments: declined  Musculoskeletal:        General: Normal range of motion.     Cervical back: Normal range of motion.   Skin:    General: Skin is warm and dry.   Neurological:     General: No focal deficit present.     Mental Status: She is alert.   Psychiatric:  Mood and Affect: Mood normal.     (all labs ordered are listed, but only abnormal results are displayed) Labs Reviewed  URINALYSIS, W/ REFLEX TO CULTURE (INFECTION SUSPECTED) - Abnormal; Notable for the following components:      Result Value   APPearance HAZY (*)    Hgb urine dipstick TRACE (*)    Leukocytes,Ua SMALL (*)    Bacteria, UA MANY (*)    All other components within normal limits  COMPREHENSIVE METABOLIC PANEL WITH GFR - Abnormal; Notable for the following components:   Potassium 3.3 (*)    Glucose, Bld 111 (*)    All other components within normal limits  URINE CULTURE  PREGNANCY, URINE  LIPASE, BLOOD  CBC    EKG: None  Radiology: US  PELVIC COMPLETE W TRANSVAGINAL AND  TORSION R/O Result Date: 05/06/2024 CLINICAL DATA:  [id number] Pelvic pain 390131 EXAM: TRANSABDOMINAL AND TRANSVAGINAL ULTRASOUND OF PELVIS DOPPLER ULTRASOUND OF OVARIES TECHNIQUE: Both transabdominal and transvaginal ultrasound examinations of the pelvis were performed. Transabdominal technique was performed for global imaging of the pelvis including uterus, ovaries, adnexal regions, and pelvic cul-de-sac. It was necessary to proceed with endovaginal exam following the transabdominal exam to visualize the uterus and ovaries. Color and duplex Doppler ultrasound was utilized to evaluate blood flow to the ovaries. COMPARISON:  Mar 30, 2023 FINDINGS: Uterus Measurements: 7.1 x 3.6 x 4.2 cm = volume: 56 mL. No fibroids or other mass visualized. Endometrium Thickness: 4 mm.  No focal abnormality visualized. Right ovary Measurements: 2.4 x 1.5 x 2.1 cm = volume: 4 mL. Normal appearance/no adnexal mass. Physiologic follicles noted. Left ovary Measurements: 3.6 x 2.6 x 2.6 cm = volume: 12.5 mL. Dominant follicle measuring 2.7 cm. Physiologic follicles noted. Pulsed Doppler evaluation demonstrates normal low-resistance arterial and venous waveforms in both ovaries. Other findings:  No abnormal free fluid IMPRESSION: Nonenlarged ovaries. No sonographic findings to suggest ovarian torsion, at this time. Otherwise, normal pelvic ultrasound. Electronically Signed   By: Rogelia Myers M.D.   On: 05/06/2024 18:59     Procedures   Medications Ordered in the ED  ketorolac  (TORADOL ) 30 MG/ML injection 30 mg (30 mg Intravenous Given 05/06/24 3671)   33 year old here for evaluation of suprapubic pain as well as frequency and urgency.  No dysuria.  She is afebrile, nonseptic, non-ill-appearing.  Her heart and lungs are clear.  Her abdomen is soft however has some mild suprapubic pain.  She denies any concern for any STIs, discharge.  She has a negative CVA tap.  Declines STI testing and pelvic exam no changes in bowel movements.   Will plan on labs, imaging and reassess  Labs and imaging personally viewed and interpreted:  CBC without leukocytosis Metabolic panel potassium 3.3 Lipase 49 UA many bacteria, small leuks Pregnancy test negative Ultrasound negative for torsion  Discussed results with patient.  Will treat for UTI and culture urine.  Will have her follow-up outpatient, return for any worsening symptoms.  Patient is nontoxic, nonseptic appearing, in no apparent distress.  Patient's pain and other symptoms adequately managed in emergency department.  Fluid bolus given.  Labs, imaging and vitals reviewed.  Patient does not meet the SIRS or Sepsis criteria.  On repeat exam patient does not have a surgical abdomin and there are no peritoneal signs.  No indication of appendicitis, bowel obstruction, bowel perforation, cholecystitis, diverticulitis, PID, intermittent, persistent torsion, TOA, ectopic pregnancy, AAA, dissection, traumatic injury.  Patient discharged home with symptomatic treatment and given strict instructions for follow-up  with their primary care physician.  I have also discussed reasons to return immediately to the ER.  Patient expresses understanding and agrees with plan.                                   Medical Decision Making Amount and/or Complexity of Data Reviewed External Data Reviewed: labs, radiology and notes. Labs: ordered. Decision-making details documented in ED Course. Radiology: ordered and independent interpretation performed. Decision-making details documented in ED Course.  Risk OTC drugs. Prescription drug management. Decision regarding hospitalization. Diagnosis or treatment significantly limited by social determinants of health.      Final diagnoses:  Suprapubic abdominal pain  Bacteria in urine    ED Discharge Orders          Ordered    cefadroxil (DURICEF) 500 MG capsule  2 times daily        05/06/24 1928    phenazopyridine (PYRIDIUM) 95 MG tablet  3 times  daily PRN        05/06/24 1928    naproxen (NAPROSYN) 500 MG tablet  2 times daily        05/06/24 1928               Bronte Sabado A, PA-C 05/06/24 1934    Patsey Lot, MD 05/06/24 2214

## 2024-05-06 NOTE — Discharge Instructions (Addendum)
 It was a taking care of you here today.  As we discussed you do have many bacteria in your urine, given location of pain your symptoms you are treating this for urinary tract infection.  Duricef- antibiotics Naproxen and Pyridium for pain  Follow-up outpatient, return for new or worsening symptoms.

## 2024-05-07 LAB — URINE CULTURE: Culture: NO GROWTH

## 2024-10-29 ENCOUNTER — Inpatient Hospital Stay (HOSPITAL_COMMUNITY)
Admission: AD | Admit: 2024-10-29 | Discharge: 2024-10-29 | Discharge status: Against Medical Advice | Payer: Self-pay | Attending: Family Medicine | Admitting: Family Medicine

## 2024-10-29 ENCOUNTER — Encounter (HOSPITAL_COMMUNITY): Payer: Self-pay

## 2024-10-29 ENCOUNTER — Ambulatory Visit (HOSPITAL_COMMUNITY)
Admission: EM | Admit: 2024-10-29 | Discharge: 2024-10-29 | Disposition: A | Discharge status: Home / Self Care | Payer: Self-pay | Attending: Family Medicine | Admitting: Family Medicine

## 2024-10-29 DIAGNOSIS — Z5321 Procedure and treatment not carried out due to patient leaving prior to being seen by health care provider: Secondary | ICD-10-CM | POA: Insufficient documentation

## 2024-10-29 DIAGNOSIS — Z3201 Encounter for pregnancy test, result positive: Secondary | ICD-10-CM

## 2024-10-29 LAB — POCT URINE PREGNANCY: Preg Test, Ur: POSITIVE — AB

## 2024-10-29 NOTE — ED Provider Notes (Signed)
 I was called to triage to evaluate patient by nursing staff.  She reports that earlier today she developed severe lower abdominal pain that has radiated to her upper abdomen.  She is currently [redacted] weeks pregnant and had a positive pregnancy test in our clinic.  Discussed that given her abdominal pain she would need to be evaluated in the MAU given our limited resources in urgent care.  She will contact a family member to take her directly to the MAU.  She was stable at the time of discharge.   Sherrell Rocky POUR, PA-C 10/29/24 1846

## 2024-10-29 NOTE — MAU Note (Signed)
 Pt left AMA- told reg- signed AMA form

## 2024-10-29 NOTE — ED Triage Notes (Signed)
 Pt states she is [redacted]wks pregnant and having abdominal pain x3 hrs. Denies vaginal d/c.

## 2024-10-29 NOTE — ED Notes (Signed)
 Patient is being discharged from the Urgent Care and sent to the Emergency Department via POV . Per Erin,PA, patient is in need of higher level of care due to abdominal pain in pregnancy. Patient is aware and verbalizes understanding of plan of care.  Vitals:   10/29/24 1846  BP: (!) 140/81  Pulse: 83  Resp: 18  Temp: 97.9 F (36.6 C)

## 2024-11-21 ENCOUNTER — Encounter: Payer: Self-pay | Admitting: *Deleted

## 2024-11-29 ENCOUNTER — Telehealth: Payer: Self-pay

## 2024-11-29 DIAGNOSIS — Z3A16 16 weeks gestation of pregnancy: Secondary | ICD-10-CM | POA: Diagnosis not present

## 2024-11-29 DIAGNOSIS — Z3480 Encounter for supervision of other normal pregnancy, unspecified trimester: Secondary | ICD-10-CM | POA: Insufficient documentation

## 2024-11-29 DIAGNOSIS — Z8759 Personal history of other complications of pregnancy, childbirth and the puerperium: Secondary | ICD-10-CM | POA: Insufficient documentation

## 2024-11-29 DIAGNOSIS — Z3482 Encounter for supervision of other normal pregnancy, second trimester: Secondary | ICD-10-CM

## 2024-11-29 DIAGNOSIS — O09899 Supervision of other high risk pregnancies, unspecified trimester: Secondary | ICD-10-CM | POA: Insufficient documentation

## 2024-11-29 DIAGNOSIS — Z98891 History of uterine scar from previous surgery: Secondary | ICD-10-CM | POA: Insufficient documentation

## 2024-11-29 NOTE — Progress Notes (Signed)
 New OB Intake  I connected with Sonya Green  on 11/29/24 at  8:15 AM EST by telephone  Visit and verified that I am speaking with the correct person using two identifiers. Nurse is located at [facility] and pt is located at home.  I discussed the limitations, risks, security and privacy concerns of performing an evaluation and management service by telephone and the availability of in person appointments. I also discussed with the patient that there may be a patient responsible charge related to this service. The patient expressed understanding and agreed to proceed.  I explained I am completing New OB Intake today. We discussed EDD of 05/11/2025 based on LMP of 08/04/24. Pt is [id number]. I reviewed her allergies, medications and Medical/Surgical/OB history.    Patient Active Problem List   Diagnosis Date Noted   Supervision of other normal pregnancy, antepartum 11/29/2024   History of pre-eclampsia 11/29/2024   Hx of preterm delivery, currently pregnant 11/29/2024   History of cesarean section 11/29/2024     Concerns addressed today Patient will be going to new [facility] location at [facility], we discussed that her Anatomy US  will be at [facility] and provided the address.   Delivery Plans Plans to deliver at [facility]. Discussed the nature of our practice with multiple providers including residents and students as well as female and female providers. Due to the size of the practice, the delivering provider may not be the same as those providing prenatal care.   Patient is not interested in water birth.  MyChart/Babyscripts MyChart access verified. I explained pt will have some visits in office and some virtually. Babyscripts instructions given and order placed. Patient verifies receipt of registration text/e-mail. Account successfully created and app downloaded. If patient is a candidate for Optimized scheduling, add to sticky note.   Blood Pressure Cuff/Weight Scale Patient has blood pressure cuff at  home. Explained after first prenatal appt pt will check weekly and document in Babyscripts. Patient does not have weight scale; patient may purchase if they desire to track weight weekly in Babyscripts.  Anatomy US  Explained first scheduled US  will be around 19 weeks. Anatomy US  scheduled for 12/31/24 at 11:00am.    Is patient a CenteringPregnancy candidate?  Not a Candidate, not available at [facility] at this time.    Is patient a Mom+Baby Combined Care candidate?  Not a candidate   If accepted, confirm patient does not intend to move from the area for at least 12 months, then notify Mom+Baby staff  Is patient a candidate for Babyscripts Optimization? No, due to history pre-eclampsia.   First visit review I reviewed new OB appt with patient. Explained pt will be seen by Letha, CNM at first visit 12/06/24 at 09:55am. Discussed [employer] genetic screening with patient. She desires [employer] and [employer]. Routine prenatal labs and genetic testing needed at [facility] Visit.   Last Pap Diagnosis  Date Value Ref Range Status  05/04/2021   Final   - Negative for Intraepithelial Lesions or Malignancy (NILM)  05/04/2021 - Benign reactive/reparative changes  Final    Sonya LITTIE Burows, RN 11/29/2024  9:06 AM

## 2024-11-29 NOTE — Patient Instructions (Signed)

## 2024-12-03 ENCOUNTER — Ambulatory Visit: Admitting: Sports Medicine

## 2024-12-06 ENCOUNTER — Encounter: Payer: Self-pay | Admitting: Obstetrics and Gynecology

## 2024-12-06 ENCOUNTER — Ambulatory Visit: Payer: Self-pay | Admitting: Obstetrics and Gynecology

## 2024-12-06 VITALS — BP 118/80 | HR 89 | Wt 168.6 lb

## 2024-12-06 DIAGNOSIS — Z3480 Encounter for supervision of other normal pregnancy, unspecified trimester: Secondary | ICD-10-CM

## 2024-12-06 DIAGNOSIS — Z3A17 17 weeks gestation of pregnancy: Secondary | ICD-10-CM

## 2024-12-06 DIAGNOSIS — O09299 Supervision of pregnancy with other poor reproductive or obstetric history, unspecified trimester: Secondary | ICD-10-CM

## 2024-12-06 NOTE — Progress Notes (Addendum)
 "   INITIAL OBSTETRICAL VISIT Patient name: Sonya Green MRN [id number]  Date of birth: 10-Sep-1991 Chief Complaint:   Initial Prenatal Visit  History of Present Illness:   Sonya Green is a 34 y.o. G90P0111 African American female at [redacted]w[redacted]d by LMP with an Estimated Date of Delivery: 05/11/25 being seen today for her initial obstetrical visit.  Her obstetrical history is significant for h/o PEC. This is an unplanned, but very desired pregnancy. She and the father of the baby (FOB) live together. She has a support system that consists of spouse/her family/friends. Today she reports some lower pelvic pain/pressure.   Patient's last menstrual period was 08/04/2024 (exact date). Last pap 05/04/2021. Results were: normal Review of Systems:   Pertinent items are noted in HPI Denies cramping/contractions, leakage of fluid, vaginal bleeding, abnormal vaginal discharge w/ itching/odor/irritation, headaches, visual changes, shortness of breath, chest pain, abdominal pain, severe nausea/vomiting, or problems with urination or bowel movements unless otherwise stated above.  Pertinent History Reviewed:  Reviewed past medical,surgical, social, obstetrical and family history.  Reviewed problem list, medications and allergies. OB History  Gravida Para Term Preterm AB Living  3 1 0 1 1 1   SAB IAB Ectopic Multiple Live Births   1  0 1    # Outcome Date GA Lbr Len/2nd Weight Sex Type Anes PTL Lv  3 Current           2 IAB 2023 [redacted]w[redacted]d         1 Preterm 07/10/17 [redacted]w[redacted]d  5 lb 3.6 oz (2.37 kg) M CS-LTranv EPI  LIV    Obstetric Comments  Hx c/s due to Pre-E   Physical Assessment:   Vitals:   12/06/24 1038  BP: 118/80  Pulse: 89  Weight: 168 lb 9.6 oz (76.5 kg)  Body mass index is 26.41 kg/m.       Physical Examination:  General appearance - well appearing, and in no distress  Mental status - alert, oriented to person, place, and time  Psych:  She has a normal mood and affect  Skin - warm and dry,  normal color, no suspicious lesions noted  Chest - effort normal, all lung fields clear to auscultation bilaterally  Heart - normal rate and regular rhythm  Abdomen - soft, nontender  Extremities:  No swelling or varicosities noted  Pelvic - VULVA: normal appearing vulva with no masses, tenderness or lesions  VAGINA: normal appearing vagina with normal color and discharge, no lesions.   CERVIX: normal appearing cervix without discharge or lesions, no CMT  Thin prep pap is not done - deferred until PP visit per patient request  FHTs by doppler: 146 bpm  Assessment & Plan:  1) Low-Risk Pregnancy [id number] at [redacted]w[redacted]d with an Estimated Date of Delivery: 05/11/25   2) Initial OB visit - Welcomed to practice and introduced self to patient in addition to discussing other advanced practice providers that she may be seeing at this practice - Congratulated patient - Anticipatory guidance on upcoming appointments - Educated on COVID19 and pregnancy and the integration of virtual appointments  - Educated on babyscripts app- patient reports she has not received email, encouraged to look in spam folder and to call office if she still has not received email - patient verbalizes understanding    3) Supervision of other normal pregnancy, antepartum (Primary) - Culture, OB Urine - GC/Chlamydia probe amp ()not at [facility] - CBC/D/Plt+RPR+Rh+ABO+RubIgG... - Hemoglobin A1c - Protein / creatinine ratio, urine - TSH - Comprehensive metabolic  panel with GFR - PANORAMA PRENATAL TEST - HORIZON CUSTOM - Flu vaccine trivalent PF, 6mos and older(Flulaval,Afluria,Fluarix,Fluzone)  4) History of pre-eclampsia in prior pregnancy, currently pregnant - Protein / creatinine ratio, urine - Comprehensive metabolic panel with GFR - Prescription for: aspirin  81 MG chewable tablet; Chew 1 tablet (81 mg total) by mouth daily.  Dispense: 30 tablet; Refill: 6  5) [redacted] weeks gestation of pregnancy    Meds:  Meds ordered  this encounter  Medications   aspirin  81 MG chewable tablet    Sig: Chew 1 tablet (81 mg total) by mouth daily.    Dispense:  30 tablet    Refill:  6    Supervising Provider:   PRATT, TANYA S [[id number]]    Initial labs obtained Continue prenatal vitamins Reviewed n/v relief measures and warning s/s to report Reviewed recommended weight gain based on pre-gravid BMI Encouraged well-balanced diet Genetic Screening discussed: ordered Cystic fibrosis, SMA, Fragile X screening discussed ordered The nature of [facility] with multiple MDs and other Advanced Practice Providers was explained to patient; also emphasized that residents, students are part of our team.  Discussed optimized OB schedule and video visits. Advised can have an in-office visit whenever she feels she needs to be seen.  Does not have own BP cuff. Explained to patient that BP will be mailed to her house. Check BP weekly, report BP >140/90. Advised to call during normal business hours and there is an after-hours nurse line available.   Indications for ASA therapy (per uptodate) One of the following: Previous pregnancy with preeclampsia, especially early onset and with an adverse outcome? Yes >>prescription for: bASA sent Multifetal gestation? No Chronic hypertension? No Type 1 or 2 diabetes mellitus? No Chronic kidney disease? No Autoimmune disease (antiphospholipid syndrome, systemic lupus erythematosus)? No   Two or more of the following: Nulliparity? No Obesity (body mass index >30 kg/m2)? No Family history of preeclampsia in mother or sister? No Age >=35 years? No Sociodemographic characteristics (African American race, low socioeconomic level)? Yes Personal risk factors (eg, previous pregnancy with low birth weight or small for gestational age infant, previous adverse pregnancy outcome [eg, stillbirth], interval >10 years between pregnancies)? No   Indications for early GDM  screening  First-degree relative with diabetes? No BMI >30kg/m2? No Age > 35 years? No Previous birth of an infant weighing >=4000 g? No Gestational diabetes mellitus in a previous pregnancy? No Glycated hemoglobin >=5.7 percent (39 mmol/mol), impaired glucose tolerance, or impaired fasting glucose on previous testing? No High-risk race/ethnicity (eg, African American, Latino, Native [address], Asian American, Pacific Islander)? Yes Previous stillbirth of unknown cause? No Maternal birthweight > 9 lbs? No History of cardiovascular disease? No Hypertension or on therapy for hypertension? No High-density lipoprotein cholesterol level <35 mg/dL (9.09 mmol/L) and/or a triglyceride level >250 mg/dL (7.17 mmol/L)? No Polycystic ovarian syndrome (PCOS)? No Physical inactivity? No Other clinical condition associated with insulin resistance (eg, severe obesity, acanthosis nigricans)? No Current use of glucocorticoids? No  Follow-up: Return in about 4 weeks (around 01/03/2025) for Return OB visit.   Orders Placed This Encounter  Procedures   Culture, OB Urine   Flu vaccine trivalent PF, 6mos and older(Flulaval,Afluria,Fluarix,Fluzone)   CBC/D/Plt+RPR+Rh+ABO+RubIgG...   Hemoglobin A1c   Protein / creatinine ratio, urine   TSH   Comprehensive metabolic panel with GFR   PANORAMA PRENATAL TEST   HORIZON CUSTOM   Interpretation:    Ala Cart MSN, CNM 12/06/2024 11:02 AM "

## 2024-12-06 NOTE — Patient Instructions (Addendum)
 "  PREGNANCY SUPPORT BELT: You are not alone, Seventy-five percent of women have some sort of abdominal or back pain at some point in their pregnancy. Your baby is growing at a fast pace, which means that your whole body is rapidly trying to adjust to the changes. As your uterus grows, your back may start feeling a bit under stress and this can result in back or abdominal pain that can go from mild, and therefore bearable, to severe pains that will not allow you to sit or lay down comfortably, When it comes to dealing with pregnancy-related pains and cramps, some pregnant women usually prefer natural remedies, which the market is filled with nowadays. For example, wearing a pregnancy support belt can help ease and lessen your discomfort and pain.  WHAT ARE THE BENEFITS OF WEARING A PREGNANCY SUPPORT BELT? A pregnancy support belt provides support to the lower portion of the belly taking some of the weight of the growing uterus and distributing to the other parts of your body. It is designed make you comfortable and gives you extra support. Over the years, the pregnancy apparel market has been studying the needs and wants of pregnant women and they have come up with the most comfortable pregnancy support belts that woman could ever ask for. In fact, you will no longer have to wear a stretched-out or bulky pregnancy belt that is visible underneath your clothes and makes you feel even more uncomfortable. Nowadays, a pregnancy support belt is made of comfortable and stretchy materials that will not irritate your skin but will actually make you feel at ease and you will not even notice you are wearing it. They are easy to put on and adjust during the day and can be worn at night for additional support.  BENEFITS: Relives Back pain Relieves Abdominal Muscle and Leg Pain Stabilizes the Pelvic Ring Offers a Cushioned Abdominal Lift Pad Relieves pressure on the Sciatic Nerve Within Minutes WHERE TO GET YOUR  Pregnancy Belt/Maternity Belt/Belly Band:    [employer]   Round Ligament Pain  The round ligaments are a pair of cord-like tissues that help support the uterus. They can become a source of pain during pregnancy as the ligaments soften and stretch as the baby grows. The pain usually begins in the second trimester (13-28 weeks) of pregnancy, and should only last for a few seconds when it occurs. However, the pain can come and go until the baby is delivered. The pain does not cause harm to the baby. Round ligament pain is usually a short, sharp, and pinching pain, but it can also be a dull, lingering, and aching pain. The pain is felt in the lower side of the abdomen or in the groin. It usually starts deep in the groin and moves up to the outside of the hip area. The pain may happen when you: Suddenly change position, such as quickly going from a sitting to standing position. Do physical activity. Cough or sneeze. Follow these instructions at home: Managing pain  When the pain starts, relax. Then, try any of these methods to help with the pain: Sit down. Flex your knees up to your abdomen. Lie on your side with one pillow under your abdomen and another pillow between your legs. Sit in a warm bath for 15-20 minutes or until the pain goes away. General instructions Watch your condition for any changes. Move slowly when you sit down or stand up. Stop or reduce your physical activities if they cause pain. Avoid  long walks if they cause pain. Take over-the-counter and prescription medicines only as told by your health care provider. Keep all follow-up visits. This is important. Contact a health care provider if: Your pain does not go away with treatment. You feel pain in your back that you did not have before. Your medicine is not helping. You have a fever or chills. You have nausea or vomiting. You have diarrhea. You have pain when you urinate. Get help right away if: You have pain that is a  rhythmic, cramping pain similar to labor pains. Labor pains are usually 2 minutes apart, last for about 1 minute, and involve a bearing down feeling or pressure in your pelvis. You have vaginal bleeding. These symptoms may represent a serious problem that is an emergency. Do not wait to see if the symptoms will go away. Get medical help right away. Call your local emergency services (911 in the [place] Do not drive yourself to the hospital. Summary Round ligament pain is felt in the lower abdomen or groin. This pain usually begins in the second trimester (13-28 weeks) and should only last for a few seconds when it occurs. You may notice the pain when you suddenly change position, when you cough or sneeze, or during physical activity. Relaxing, flexing your knees to your abdomen, lying on one side, or taking a warm bath may help to get rid of the pain. Contact your health care provider if the pain does not go away. This information is not intended to replace advice given to you by your health care provider. Make sure you discuss any questions you have with your health care provider. Document Revised: 01/07/2021 Document Reviewed: 01/07/2021 Elsevier Patient Education  2024 [employer]Second Trimester of Pregnancy  The second trimester of pregnancy is from week 14 through week 27. This is months 4 through 6 of pregnancy. During the second trimester: Morning sickness is less or has stopped. You may have more energy. You may feel hungry more often. At this time, your unborn baby is growing very fast. At the end of the sixth month, the unborn baby may be up to 12 inches long and weigh about 1 pounds. You will likely start to feel the baby move between 16 and 20 weeks of pregnancy. Body changes during your second trimester Your body continues to change during this time. The changes usually go away after your baby is born. Physical changes You will gain more weight. Your belly will get bigger. You  may begin to get stretch marks on your hips, belly, and breasts. Your breasts will keep growing and may hurt. You may get dark spots or blotches on your face. A dark line from your belly button to the pubic area may appear. This line is called linea nigra. Your hair may grow faster and get thicker. Health changes You may have headaches. You may have heartburn. You may pee more often. You may have swollen, bulging veins (varicose veins). You may have trouble pooping (constipation), or swollen veins in the butt that can itch or get painful (hemorrhoids). You may have back pain. This is caused by: Weight gain. Pregnancy hormones that are relaxing the joints in your pelvis. Follow these instructions at home: Medicines Talk to your health care provider if you're taking medicines. Ask if the medicines are safe to take during pregnancy. Your provider may change the medicines that you take. Do not take any medicines unless told to by your provider. Take a prenatal vitamin that has  at least 600 micrograms (mcg) of folic acid. Do not use herbal medicines, illegal drugs, or medicines that are not approved by your provider. Eating and drinking While you're pregnant your body needs extra food for your growing baby. Talk with your provider about what to eat while pregnant. Activity Most women are able to exercise during pregnancy. Exercises may need to change as your pregnancy goes on. Talk to your provider about your activities and exercise routines. Relieving pain and discomfort Wear a good, supportive bra if your breasts hurt. Rest with your legs raised if you have leg cramps or low back pain. Take warm sitz baths to soothe pain from hemorrhoids. Use hemorrhoid cream if your provider says it's okay. Do not douche. Do not use tampons or scented pads. Do not use hot tubs, steam rooms, or saunas. Safety Wear your seatbelt at all times when you're in a car. Talk to your provider if someone hits  you, hurts you, or yells at you. Talk with your provider if you're feeling sad or have thoughts of hurting yourself. Lifestyle Certain things can be harmful while you're pregnant. It's best to avoid the following: Do not drink alcohol,smoke, vape, or use products with nicotine or tobacco in them. If you need help quitting, talk with your provider. Avoid cat litter boxes and soil used by cats. These things carry germs that can cause harm to your pregnancy and your baby. General instructions Keep all follow-up visits. It helps you and your unborn baby stay as healthy as possible. Write down your questions. Take them to your prenatal visits. Your provider will: Talk with you about your overall health. Give you advice or refer you to specialists who can help with different needs, including: Prenatal education classes. Mental health and counseling. Foods and healthy eating. Ask for help if you need help with food. Where to find more information American Pregnancy Association: americanpregnancy.org [employer]: acog.org Office on [employer] Health: [place] Contact a health care provider if: You have a headache that does not go away when you take medicine. You have any of these problems: You can't eat or drink. You throw up or feel like you may throw up. You have watery poop (diarrhea) for 2 days or more. You have pain when you pee or your pee smells bad. You have been sick for 2 days or more and are not getting better. Contact your provider right away if: You have any of these coming from your vagina: Abnormal discharge. Bad-smelling fluid. Bleeding. Your baby is moving less than usual. You have contractions, belly cramping, or have pain in your pelvis or lower back. You have symptoms of high blood pressure or preeclampsia. These include: A severe, throbbing headache that does not go away. Sudden or extreme swelling of your face, hands, legs,  or feet. Vision problems: You see spots. You have blurry vision. Your eyes are sensitive to light. If you can't reach the provider, go to an urgent care or emergency room. Get help right away if: You faint, become confused, or can't think clearly. You have chest pain or trouble breathing. You have any kind of injury, such as from a fall or a car crash. These symptoms may be an emergency. Call 911 right away. Do not wait to see if the symptoms will go away. Do not drive yourself to the hospital. This information is not intended to replace advice given to you by your health care provider. Make sure you discuss any questions  you have with your health care provider. Document Revised: 07/28/2023 Document Reviewed: 02/25/2023 Elsevier Patient Education  2024 [employer]. "

## 2024-12-07 LAB — CBC/D/PLT+RPR+RH+ABO+RUBIGG...
Antibody Screen: NEGATIVE
Basophils Absolute: 0 10*3/uL (ref 0.0–0.2)
Basos: 0 %
EOS (ABSOLUTE): 0 10*3/uL (ref 0.0–0.4)
Eos: 1 %
HCV Ab: NONREACTIVE
HIV Screen 4th Generation wRfx: NONREACTIVE
Hematocrit: 38.6 % (ref 34.0–46.6)
Hemoglobin: 13.2 g/dL (ref 11.1–15.9)
Hepatitis B Surface Ag: NEGATIVE
Immature Grans (Abs): 0.1 10*3/uL (ref 0.0–0.1)
Immature Granulocytes: 1 %
Lymphocytes Absolute: 1.4 10*3/uL (ref 0.7–3.1)
Lymphs: 21 %
MCH: 31.7 pg (ref 26.6–33.0)
MCHC: 34.2 g/dL (ref 31.5–35.7)
MCV: 93 fL (ref 79–97)
Monocytes Absolute: 0.5 10*3/uL (ref 0.1–0.9)
Monocytes: 7 %
Neutrophils Absolute: 4.6 10*3/uL (ref 1.4–7.0)
Neutrophils: 70 %
Platelets: 255 10*3/uL (ref 150–450)
RBC: 4.17 x10E6/uL (ref 3.77–5.28)
RDW: 12.4 % (ref 11.7–15.4)
RPR Ser Ql: NONREACTIVE
Rh Factor: POSITIVE
Rubella Antibodies, IGG: 4.49 {index}
WBC: 6.5 10*3/uL (ref 3.4–10.8)

## 2024-12-07 LAB — COMPREHENSIVE METABOLIC PANEL WITH GFR
ALT: 28 [IU]/L (ref 0–32)
AST: 25 [IU]/L (ref 0–40)
Albumin: 4 g/dL (ref 3.9–4.9)
Alkaline Phosphatase: 49 [IU]/L (ref 41–116)
BUN/Creatinine Ratio: 12 (ref 9–23)
BUN: 7 mg/dL (ref 6–20)
Bilirubin Total: 0.4 mg/dL (ref 0.0–1.2)
CO2: 20 mmol/L (ref 20–29)
Calcium: 9.6 mg/dL (ref 8.7–10.2)
Chloride: 99 mmol/L (ref 96–106)
Creatinine, Ser: 0.57 mg/dL (ref 0.57–1.00)
Globulin, Total: 2.5 g/dL (ref 1.5–4.5)
Glucose: 58 mg/dL — ABNORMAL LOW (ref 70–99)
Potassium: 3.9 mmol/L (ref 3.5–5.2)
Sodium: 135 mmol/L (ref 134–144)
Total Protein: 6.5 g/dL (ref 6.0–8.5)
eGFR: 125 mL/min/{1.73_m2}

## 2024-12-07 LAB — HEMOGLOBIN A1C
Est. average glucose Bld gHb Est-mCnc: 97 mg/dL
Hgb A1c MFr Bld: 5 % (ref 4.8–5.6)

## 2024-12-07 LAB — PROTEIN / CREATININE RATIO, URINE
Creatinine, Urine: 186.4 mg/dL
Protein, Ur: 33.9 mg/dL
Protein/Creat Ratio: 182 mg/g{creat} (ref 0–200)

## 2024-12-07 LAB — HCV INTERPRETATION

## 2024-12-07 LAB — TSH: TSH: 1.41 u[IU]/mL (ref 0.450–4.500)

## 2024-12-08 ENCOUNTER — Encounter: Payer: Self-pay | Admitting: Obstetrics and Gynecology

## 2024-12-08 MED ORDER — ASPIRIN 81 MG PO CHEW: 81.0000 mg | CHEWABLE_TABLET | Freq: Every day | ORAL | 6 refills | Status: AC

## 2024-12-10 LAB — URINE CULTURE, OB REFLEX

## 2024-12-10 LAB — CULTURE, OB URINE

## 2024-12-11 ENCOUNTER — Ambulatory Visit: Payer: Self-pay | Admitting: Obstetrics and Gynecology

## 2024-12-11 DIAGNOSIS — O2342 Unspecified infection of urinary tract in pregnancy, second trimester: Secondary | ICD-10-CM

## 2024-12-11 MED ORDER — NITROFURANTOIN MONOHYD MACRO 100 MG PO CAPS: 100.0000 mg | ORAL_CAPSULE | Freq: Two times a day (BID) | ORAL | 0 refills | Status: AC

## 2024-12-31 ENCOUNTER — Other Ambulatory Visit

## 2024-12-31 ENCOUNTER — Ambulatory Visit

## 2025-01-03 ENCOUNTER — Encounter: Payer: Self-pay | Admitting: Obstetrics and Gynecology

## 2025-01-31 ENCOUNTER — Encounter: Payer: Self-pay | Admitting: Obstetrics and Gynecology

## 2025-02-28 ENCOUNTER — Encounter: Payer: Self-pay | Admitting: Obstetrics and Gynecology

## 2025-05-11 ENCOUNTER — Inpatient Hospital Stay (HOSPITAL_COMMUNITY): Admit: 2025-05-11
